# Patient Record
Sex: Female | Born: 1965 | Race: White | Hispanic: No | Marital: Married | Smoking: Current every day smoker
Health system: Southern US, Community
[De-identification: ages and names within clinical notes are randomized; demographics above are authoritative.]

---

## 1997-07-30 ENCOUNTER — Encounter: Admission: RE | Admit: 1997-07-30 | Discharge: 1997-07-30 | Payer: Self-pay | Admitting: Family Medicine

## 1997-08-17 ENCOUNTER — Encounter (INDEPENDENT_AMBULATORY_CARE_PROVIDER_SITE_OTHER): Payer: Self-pay | Admitting: *Deleted

## 1997-09-02 ENCOUNTER — Other Ambulatory Visit: Admission: RE | Admit: 1997-09-02 | Discharge: 1997-09-02 | Payer: Self-pay | Admitting: *Deleted

## 1997-09-02 ENCOUNTER — Encounter: Admission: RE | Admit: 1997-09-02 | Discharge: 1997-09-02 | Payer: Self-pay | Admitting: Sports Medicine

## 1997-09-08 ENCOUNTER — Ambulatory Visit (HOSPITAL_COMMUNITY): Admission: RE | Admit: 1997-09-08 | Discharge: 1997-09-08 | Payer: Self-pay | Admitting: *Deleted

## 1997-10-06 ENCOUNTER — Encounter: Admission: RE | Admit: 1997-10-06 | Discharge: 1997-10-06 | Payer: Self-pay | Admitting: Family Medicine

## 1998-02-12 ENCOUNTER — Encounter: Admission: RE | Admit: 1998-02-12 | Discharge: 1998-02-12 | Payer: Self-pay | Admitting: Family Medicine

## 1998-02-23 ENCOUNTER — Encounter: Admission: RE | Admit: 1998-02-23 | Discharge: 1998-02-23 | Payer: Self-pay | Admitting: Sports Medicine

## 1999-06-09 ENCOUNTER — Encounter: Admission: RE | Admit: 1999-06-09 | Discharge: 1999-06-09 | Payer: Self-pay | Admitting: Family Medicine

## 2006-03-17 ENCOUNTER — Encounter (INDEPENDENT_AMBULATORY_CARE_PROVIDER_SITE_OTHER): Payer: Self-pay | Admitting: *Deleted

## 2010-09-23 ENCOUNTER — Other Ambulatory Visit (HOSPITAL_COMMUNITY): Payer: Self-pay | Admitting: Gastroenterology

## 2010-09-27 ENCOUNTER — Encounter (HOSPITAL_COMMUNITY)
Admission: RE | Admit: 2010-09-27 | Discharge: 2010-09-27 | Disposition: A | Discharge status: Home / Self Care | Payer: BC Managed Care – PPO | Source: Ambulatory Visit | Attending: Gastroenterology | Admitting: Gastroenterology

## 2010-09-27 DIAGNOSIS — R1011 Right upper quadrant pain: Secondary | ICD-10-CM | POA: Insufficient documentation

## 2010-09-27 MED ORDER — TECHNETIUM TC 99M MEBROFENIN IV KIT
5.5000 | PACK | Freq: Once | INTRAVENOUS | Status: AC | PRN
  Administered 2010-09-27: 6 via INTRAVENOUS

## 2011-11-27 ENCOUNTER — Ambulatory Visit: Payer: BC Managed Care – PPO

## 2011-11-27 ENCOUNTER — Ambulatory Visit (INDEPENDENT_AMBULATORY_CARE_PROVIDER_SITE_OTHER): Payer: BC Managed Care – PPO | Admitting: Family Medicine

## 2011-11-27 VITALS — BP 94/65 | HR 72 | Temp 97.9°F | Resp 18 | Ht 68.0 in | Wt 148.0 lb

## 2011-11-27 DIAGNOSIS — M79609 Pain in unspecified limb: Secondary | ICD-10-CM

## 2011-11-27 DIAGNOSIS — M79606 Pain in leg, unspecified: Secondary | ICD-10-CM

## 2011-11-27 DIAGNOSIS — M79671 Pain in right foot: Secondary | ICD-10-CM

## 2011-11-27 DIAGNOSIS — M25561 Pain in right knee: Secondary | ICD-10-CM

## 2011-11-27 DIAGNOSIS — M25569 Pain in unspecified knee: Secondary | ICD-10-CM

## 2011-11-27 NOTE — Progress Notes (Signed)
[facility]:  Office Visit  Chief Complaint:  Chief Complaint  Patient presents with  . Leg Pain    rt leg pain x 2 months  hx of broken toe feel somthing since  has a few bad veins     HPI: Gina Mckenzie is a 45 y.o. female who complains of: Inventory specialist at [employer] Broke right 5th toe fracture x 2 years and foot has not been the same. Continues to have intermittent foot pain in 5th, 4th, 3rd toes when walking and at MTP pf 5th and 4th toes/  She has cramps in her calf but not  necussearily pain. Pain is in her butt cheek. Deneis any swelling. +Varicose veins. Posterior knee pain. She has pain in back of her knee when she walks. Dull pain , intermittent Has broken vessels with bruising allalong her leg at different areas at different times. Does not necessarily feel any different when walking or at rest. No h/o smoking, HTN, hyperlipidemia, CAD, PAD.     No h/o blood clots. Active. No long plane rides, no swelling. No SOB/DOE. No risk factors for DVT  History reviewed. No pertinent past medical history. History reviewed. No pertinent past surgical history. History   Social History  . Marital Status: Married    Spouse Name: N/A    Number of Children: N/A  . Years of Education: N/A   Social History Main Topics  . Smoking status: Current Every Day Smoker -- 0.5 packs/day    Types: Cigarettes  . Smokeless tobacco: Never Used  . Alcohol Use: No  . Drug Use: No  . Sexually Active: None   Other Topics Concern  . None   Social History Narrative  . None   No family history on file. No Known Allergies Prior to Admission medications   Not on File     ROS: The patient denies fevers, chills, night sweats, unintentional weight loss, chest pain, palpitations, wheezing, dyspnea on exertion, nausea, vomiting, abdominal pain, dysuria, hematuria, melena, numbness, weakness, or tingling.   All other systems have been reviewed and were otherwise negative  with the exception of those mentioned in the HPI and as above.    PHYSICAL EXAM: Filed Vitals:   11/27/11 1248  BP: 94/65  Pulse: 72  Temp: 97.9 F (36.6 C)  Resp: 18   Filed Vitals:   11/27/11 1248  Height: 5\' 8"  (1.727 m)  Weight: 148 lb (67.132 kg)   Body mass index is 22.50 kg/(m^2).  General: Alert, no acute distress HEENT:  Normocephalic, atraumatic, oropharynx patent.  Cardiovascular:  Regular rate and rhythm, no rubs murmurs or gallops.  No Carotid bruits, radial pulse intact. No pedal edema.  Respiratory: Clear to auscultation bilaterally.  No wheezes, rales, or rhonchi.  No cyanosis, no use of accessory musculature GI: No organomegaly, abdomen is soft and non-tender, positive bowel sounds.  No masses. Skin: No rashes. Neurologic: Facial musculature symmetric. Psychiatric: Patient is appropriate throughout our interaction. Lymphatic: No cervical lymphadenopathy Musculoskeletal: Gait intact. Right knee-+ posterior tenderness, anterior knee exam nl, no crepitus, no deformity, 5/5 strength, 2/2 DTR. Neg to varus/valgus stress, neg Lachman, neg jt line tenderness, + DP, spider vains along thigh, small bruise at larger varicosity on medial aspect. ? Edema posterior knee ? Cyst Right foot-nl ROM, 5/5 strength, nl sensation, + tender at MTP Right ankle-nl exam Right hip-nl exam  LABS: Results for orders placed in visit on 08/17/97  CONVERTED [facility] LAB  Component Value Range   Pap Smear Done.       EKG/XRAY:   Primary read interpreted by Dr. Conley Rolls at [facility] ? + right knee posterior osteophyte Right Old boney abnormality distal 5th metartarsal No acute fractures/subluxation  ASSESSMENT/PLAN: Encounter Diagnoses  Name Primary?  . Knee pain, right Yes  . Foot pain, right    Metatarsal Pads for foot pain ? Venous insufficiency resulting in intermittent  leg pain and burst varicose veins, along with popliteal cyst causing posterior knee pain. Unlikely DVT  Since no  risk factors Will get venous insufficiency study    LE, THAO PHUONG, DO 11/27/2011 1:55 PM

## 2011-12-02 ENCOUNTER — Ambulatory Visit
Admission: RE | Admit: 2011-12-02 | Discharge: 2011-12-02 | Disposition: A | Discharge status: Home / Self Care | Payer: BC Managed Care – PPO | Source: Ambulatory Visit | Attending: Family Medicine | Admitting: Family Medicine

## 2011-12-02 DIAGNOSIS — M79606 Pain in leg, unspecified: Secondary | ICD-10-CM

## 2011-12-02 DIAGNOSIS — M79671 Pain in right foot: Secondary | ICD-10-CM

## 2011-12-02 DIAGNOSIS — M25561 Pain in right knee: Secondary | ICD-10-CM

## 2011-12-05 ENCOUNTER — Other Ambulatory Visit: Payer: Self-pay | Admitting: Family Medicine

## 2011-12-05 DIAGNOSIS — M79671 Pain in right foot: Secondary | ICD-10-CM

## 2011-12-05 DIAGNOSIS — M25561 Pain in right knee: Secondary | ICD-10-CM

## 2011-12-05 DIAGNOSIS — M79606 Pain in leg, unspecified: Secondary | ICD-10-CM

## 2011-12-08 ENCOUNTER — Ambulatory Visit
Admission: RE | Admit: 2011-12-08 | Discharge: 2011-12-08 | Disposition: A | Discharge status: Home / Self Care | Payer: BC Managed Care – PPO | Source: Ambulatory Visit | Attending: Family Medicine | Admitting: Family Medicine

## 2011-12-08 ENCOUNTER — Other Ambulatory Visit: Payer: Self-pay | Admitting: Family Medicine

## 2011-12-08 DIAGNOSIS — M79671 Pain in right foot: Secondary | ICD-10-CM

## 2011-12-08 DIAGNOSIS — M25561 Pain in right knee: Secondary | ICD-10-CM

## 2011-12-08 DIAGNOSIS — M79606 Pain in leg, unspecified: Secondary | ICD-10-CM

## 2011-12-08 NOTE — Progress Notes (Signed)
Patient ID: Gina Mckenzie, female   DOB: 1965/04/05, 47 y.o.   MRN: [id number]  INTERVENTIONAL RADIOLOGY NEW PATIENT OFFICE VISIT  Chief Complaint: Right lower extremity pain.  History: The patient is referred by Dr. Conley Rolls for evaluation of possible venous insufficiency or symptomatic Baker's cyst.  Past Medical History: The patient is a 46 year old female with a history of approximately 2 years of right lower extremity pain, localizing primarily to the region of the popliteal fossa. She does have a history of a prior fifth toe fracture of the right foot two years ago and also does complain of periodic pain in the foot. The patient does not have any known history of varicose veins and has not had any prior varicose vein treatment. She has no history of DVT or pulmonary embolism. She has not had any right lower extremity surgery. She denies any edema or discoloration of the extremity. She has not had any ulcers. She does state that pain is worse with prolonged periods of standing.  Medications: No regular prescription medications or over-the- counter medications.  Allergies: No known drug allergies.  Social History: The patient is married and has two children aged 31 and 66. She lives in [place] and works for [employer]. She smokes approximately 15 cigarettes per day and has smoked for approximately 30 years. She denies alcohol use.  Family History: Family history of hypertension.  Review of Systems: General: 5 feet 7 inches in height and 149 pounds in weight. Gastrointestinal: No abdominal pain. Genitourinary: Two prior pregnancies and deliveries. No urinary symptoms. Chest: No chest pain. Neurologic: No weakness or difficulty with ambulation. Respiratory: No shortness of breath.  Exam: Vital signs: Blood pressure 111/63, pulse 71, respirations 16, temperature 99.2, oxygen saturation 99% on room air. General: No acute distress. Right lower extremity: No edema or  focal tenderness. Full range of motion and normal strength. No palpable varicosities or skin changes. Small healing bruise of the medial knee region. No palpable masses or fullness in the popliteal fossa. Normal palpable distal pedal pulses. Left lower extremity: No edema, palpable varicosities or masses. Normal strength and motion. Normal distal pulses.  Imaging: Venous evaluation of the right lower extremity was performed today. The left lower extremity was not studied with ultrasound given lack of any clinical symptoms on the left. The study is separately dictated and demonstrates no evidence of DVT or superficial venous insufficiency. No varicosities are identified. There is no evidence of Baker's cyst or soft tissue mass in the popliteal fossa.  Assessment and Plan: Evaluation today demonstrates no evidence of superficial venous insufficiency of the right lower extremity or popliteal fossa cyst to explain the patient's symptoms. No venous therapy is therefore indicated. I recommended that the patient seek orthopedic/sports medicine evaluation if there is any worsening pain localizing to the knee joint itself or the right foot.

## 2011-12-14 ENCOUNTER — Telehealth: Payer: Self-pay | Admitting: Family Medicine

## 2011-12-14 NOTE — Telephone Encounter (Signed)
LM that US venous insufficency negative report for venous refluz or popliteal cyst

## 2015-04-20 ENCOUNTER — Telehealth: Payer: Self-pay

## 2015-04-20 NOTE — Telephone Encounter (Signed)
Patient needs FMLA forms completed by Lanier ClamNicole Bush, so that she can take care of her mother. I have completed what I could from the OV on 03/23/15 (the mothers MRN is [id number]) I have highlighted the areas that need to be completed that I wasn't sure about, If you could complete this and return to the FMLA/Disability box at the 102 checkout desk within 5-7 business days. Thank you!

## 2015-04-23 NOTE — Telephone Encounter (Signed)
FMLA completed and ready for pick up.

## 2015-04-24 NOTE — Telephone Encounter (Signed)
Paperwork was scanned and faxed to [phone] on 04/24/15

## 2015-04-28 DIAGNOSIS — Z0271 Encounter for disability determination: Secondary | ICD-10-CM

## 2016-02-18 ENCOUNTER — Ambulatory Visit (INDEPENDENT_AMBULATORY_CARE_PROVIDER_SITE_OTHER): Payer: No Typology Code available for payment source | Admitting: Physician Assistant

## 2016-02-18 VITALS — BP 98/62 | HR 88 | Temp 98.7°F | Resp 16 | Ht 67.5 in | Wt 137.6 lb

## 2016-02-18 DIAGNOSIS — R69 Illness, unspecified: Secondary | ICD-10-CM | POA: Diagnosis not present

## 2016-02-18 DIAGNOSIS — J111 Influenza due to unidentified influenza virus with other respiratory manifestations: Secondary | ICD-10-CM

## 2016-02-18 MED ORDER — OSELTAMIVIR PHOSPHATE 75 MG PO CAPS: 75.0000 mg | ORAL_CAPSULE | Freq: Two times a day (BID) | ORAL | 0 refills | Status: DC

## 2016-02-18 NOTE — Progress Notes (Signed)
  02/18/2016 3:44 PM   DOB: 11/19/1965 / MRN: [id number]  SUBJECTIVE:  Kendra OpitzVESNA Pursifull is a 51 y.o. female presenting for an illness that started Tuesday night. Yesterday she has had chills, HA, and mild myalgia, cough along with fatigue.  Denies nasal congestion and sore throat.  Has been taking nyquil and dayquil. She is pushing fluids but is not hungry.  She denies abdominal pain, chest pain, SOB, leg swelling.   She has not had the flu shot.   She has No Known Allergies.   She  has no past medical history on file.    She  reports that she has been smoking Cigarettes.  She has been smoking about 0.50 packs per day. She has never used smokeless tobacco. She reports that she does not drink alcohol or use drugs. She  has no sexual activity history on file. The patient  has no past surgical history on file.  Her family history is not on file.  Review of Systems  Constitutional: Negative for chills and fever.  Skin: Negative for itching and rash.  Neurological: Positive for dizziness.    The problem list and medications were reviewed and updated by myself where necessary and exist elsewhere in the encounter.   OBJECTIVE:  BP 98/62 (BP Location: Right Arm, Patient Position: Sitting, Cuff Size: Normal)   Pulse 88   Temp 98.7 F (37.1 C) (Oral)   Resp 16   Ht 5' 7.5" (1.715 m)   Wt 137 lb 9.6 oz (62.4 kg)   SpO2 95%   BMI 21.23 kg/m    Physical Exam  Constitutional: She is oriented to person, place, and time. She appears well-developed and well-nourished.  HENT:  Right Ear: External ear normal.  Left Ear: External ear normal.  Mouth/Throat: Oropharynx is clear and moist. No oropharyngeal exudate.  Cardiovascular: Normal rate, regular rhythm and normal heart sounds.   Pulmonary/Chest: Effort normal and breath sounds normal. She has no wheezes. She has no rales.  Musculoskeletal: Normal range of motion.  Neurological: She is alert and oriented to person, place, and time.  Skin: Skin is  warm and dry.  Psychiatric: She has a normal mood and affect.    No results found for this or any previous visit (from the past 72 hour(s)).  No results found.  ASSESSMENT AND PLAN:  Nijae was seen today for headache and chills.  Diagnoses and all orders for this visit:  Influenza-like illness: Normal exam.  Likely flu.  She has not had the flu shot.  Benign health history.  Starting tamiflu.  Prescribing tamiflu prophylaxis for her mother who is elderly and also a patient of mine.  Sent via [facility]. Out of work until Monday.  -     oseltamivir (TAMIFLU) 75 MG capsule; Take 1 capsule (75 mg total) by mouth 2 (two) times daily.    The patient is advised to call or return to clinic if she does not see an improvement in symptoms, or to seek the care of the closest emergency department if she worsens with the above plan.   Deliah BostonMichael Clark, MHS, PA-C [facility] 02/18/2016 3:44 PM

## 2016-02-18 NOTE — Patient Instructions (Addendum)
Take 2-3 tabs of ibuprofen every 6-8 hours as needed for pain.      IF you received an x-ray today, you will receive an invoice from [facility]. Please contact [facility] at [phone] with questions or concerns regarding your invoice.   IF you received labwork today, you will receive an invoice from [facility]. Please contact [facility] at [phone] with questions or concerns regarding your invoice.   Our billing staff will not be able to assist you with questions regarding bills from these companies.  You will be contacted with the lab results as soon as they are available. The fastest way to get your results is to activate your My Chart account. Instructions are located on the last page of this paperwork. If you have not heard from us regarding the results in 2 weeks, please contact this office.

## 2016-03-24 ENCOUNTER — Ambulatory Visit: Payer: Self-pay | Admitting: Registered Nurse

## 2016-03-24 VITALS — BP 98/58 | HR 68 | Temp 97.4°F

## 2016-03-24 DIAGNOSIS — H65193 Other acute nonsuppurative otitis media, bilateral: Secondary | ICD-10-CM

## 2016-03-24 MED ORDER — IBUPROFEN 800 MG PO TABS: 800.0000 mg | ORAL_TABLET | Freq: Three times a day (TID) | ORAL | 0 refills | Status: DC

## 2016-03-24 NOTE — Patient Instructions (Signed)
Take amoxicillin 875mg  by mouth every 12 hours x 10 days Motrin 800mg  by mouth three times per day x 2 days Consider OTC antihistamine claritin/zyrtec 10mg  po daily and/or  Fluticasone 50mcg 1 spray each nostril twice a day

## 2016-03-24 NOTE — Progress Notes (Signed)
Pt c/o R ear pain extending down neck since last night. Denies ear drainage. Took Ibuprofen 800mg  this am with relief.  Subjective:    Patient ID: Gina Mckenzie, female    DOB: [birth year], 51 y.o.   MRN: [id number]  51y/o caucasian female married here for evaluation right ear pain woke her up in middle of night.  Pain with swallowing.  RN Nance Pew gave her motrin from clinic stock this am and it helped a great deal with pain.  Denied discharge/bleeding ears.  Denied seasonal allergies/recent URI symptoms.  Denied teeth pain/cough/nausea/vomiting/diarrhea/rash.  Hurts to touch outside of ear right also and coworker thought her face was swollen this morning before she took motrin.       Review of Systems  Constitutional: Negative for activity change, appetite change, chills, diaphoresis, fatigue, fever and unexpected weight change.  HENT: Positive for ear pain and facial swelling. Negative for congestion, dental problem, drooling, ear discharge, hearing loss, mouth sores, nosebleeds, postnasal drip, rhinorrhea, sinus pain, sinus pressure, sneezing, sore throat, tinnitus, trouble swallowing and voice change.   Eyes: Negative for photophobia, pain, discharge, redness, itching and visual disturbance.  Respiratory: Negative for cough, choking, chest tightness, shortness of breath, wheezing and stridor.   Cardiovascular: Negative for chest pain, palpitations and leg swelling.  Gastrointestinal: Negative for abdominal distention, abdominal pain, blood in stool, constipation, diarrhea, nausea and vomiting.  Endocrine: Negative for cold intolerance and heat intolerance.  Genitourinary: Negative for difficulty urinating, dysuria and hematuria.  Musculoskeletal: Negative for arthralgias, back pain, gait problem, joint swelling, myalgias, neck pain and neck stiffness.  Skin: Negative for color change, pallor, rash and wound.  Allergic/Immunologic: Negative for environmental allergies and food allergies.   Neurological: Negative for dizziness, tremors, seizures, syncope, facial asymmetry, speech difficulty, weakness, light-headedness, numbness and headaches.  Hematological: Negative for adenopathy. Does not bruise/bleed easily.  Psychiatric/Behavioral: Negative for agitation, behavioral problems, confusion and sleep disturbance.       Objective:   Physical Exam  Constitutional: She is oriented to person, place, and time. Vital signs are normal. She appears well-developed and well-nourished. She is active and cooperative.  Non-toxic appearance. She does not have a sickly appearance. She does not appear ill. No distress.  HENT:  Head: Normocephalic and atraumatic.  Right Ear: Hearing and ear canal normal. There is tenderness. Tympanic membrane is erythematous and bulging. A middle ear effusion is present.  Left Ear: Hearing and ear canal normal. There is tenderness. Tympanic membrane is erythematous and bulging. A middle ear effusion is present.  Nose: Mucosal edema and rhinorrhea present. No nose lacerations, sinus tenderness, nasal deformity, septal deviation or nasal septal hematoma. No epistaxis.  No foreign bodies. Right sinus exhibits no maxillary sinus tenderness and no frontal sinus tenderness. Left sinus exhibits no maxillary sinus tenderness and no frontal sinus tenderness.  Mouth/Throat: Uvula is midline and mucous membranes are normal. Mucous membranes are not pale, not dry and not cyanotic. She does not have dentures. No oral lesions. No trismus in the jaw. Normal dentition. No dental abscesses, uvula swelling, lacerations or dental caries. Posterior oropharyngeal edema and posterior oropharyngeal erythema present. No oropharyngeal exudate or tonsillar abscesses.  Cobblestoning posterior pharynx; bilateral TMS air fluid level clear; TM injection 1/4 surface and auditory canal with erythema 4-8 oclock no debris noted; bilateral allergic shiners; bilateral nasal turbinates edema/erythema  clear discharge  Eyes: Conjunctivae, EOM and lids are normal. Pupils are equal, round, and reactive to light. Right eye exhibits no chemosis, no discharge, no  exudate and no hordeolum. No foreign body present in the right eye. Left eye exhibits no chemosis, no discharge, no exudate and no hordeolum. No foreign body present in the left eye. Right conjunctiva is not injected. Right conjunctiva has no hemorrhage. Left conjunctiva is not injected. Left conjunctiva has no hemorrhage. No scleral icterus. Right eye exhibits normal extraocular motion and no nystagmus. Left eye exhibits normal extraocular motion and no nystagmus. Right pupil is round and reactive. Left pupil is round and reactive. Pupils are equal.  Neck: Trachea normal and normal range of motion. Neck supple. No tracheal tenderness, no spinous process tenderness and no muscular tenderness present. No neck rigidity. No tracheal deviation, no edema, no erythema and normal range of motion present. No thyroid mass and no thyromegaly present.  Cardiovascular: Normal rate, regular rhythm, S1 normal, S2 normal, normal heart sounds and intact distal pulses.  PMI is not displaced.  Exam reveals no gallop and no friction rub.   No murmur heard. Pulmonary/Chest: Effort normal and breath sounds normal. No accessory muscle usage or stridor. No respiratory distress. She has no decreased breath sounds. She has no wheezes. She has no rhonchi. She has no rales. She exhibits no tenderness.  Abdominal: Soft. She exhibits no distension.  Musculoskeletal: Normal range of motion. She exhibits no edema or tenderness.       Right shoulder: Normal.       Left shoulder: Normal.       Right hip: Normal.       Left hip: Normal.       Right knee: Normal.       Left knee: Normal.       Cervical back: Normal.       Right hand: Normal.       Left hand: Normal.  Lymphadenopathy:       Head (right side): No submental, no submandibular, no tonsillar, no preauricular, no  posterior auricular and no occipital adenopathy present.       Head (left side): No submental, no submandibular, no tonsillar, no preauricular, no posterior auricular and no occipital adenopathy present.    She has no cervical adenopathy.       Right cervical: No superficial cervical, no deep cervical and no posterior cervical adenopathy present.      Left cervical: No superficial cervical, no deep cervical and no posterior cervical adenopathy present.  Neurological: She is alert and oriented to person, place, and time. She has normal strength. She is not disoriented. She displays no atrophy and no tremor. No cranial nerve deficit or sensory deficit. She exhibits normal muscle tone. She displays no seizure activity. Coordination and gait normal. GCS eye subscore is 4. GCS verbal subscore is 5. GCS motor subscore is 6.  Skin: Skin is warm, dry and intact. No abrasion, no bruising, no burn, no ecchymosis, no laceration, no lesion, no petechiae and no rash noted. She is not diaphoretic. No cyanosis or erythema. No pallor. Nails show no clubbing.  Psychiatric: She has a normal mood and affect. Her speech is normal and behavior is normal. Judgment and thought content normal. Cognition and memory are normal.  Nursing note and vitals reviewed.         Assessment & Plan:  A-bilateral otitis media acute nonsupportive  P-Treatment as ordered. Amoxicillin 875mg  po BID X 10 days #[facility] 800mg  po TID prn pain x 2 days given UD from clinic stock x 6. Symptomatic therapy suggested fluids, NSAIDs and rest.  May take Tylenol or Motrin for fevers.  Call or return to clinic as needed if these symptoms worsen or fail to improve as anticipated.  Patient verbalized agreement and understanding of treatment plan and had no further questions at this time. P2:  Hand washing

## 2016-09-16 ENCOUNTER — Ambulatory Visit (INDEPENDENT_AMBULATORY_CARE_PROVIDER_SITE_OTHER): Payer: No Typology Code available for payment source | Admitting: Physician Assistant

## 2016-09-16 ENCOUNTER — Encounter: Payer: Self-pay | Admitting: Physician Assistant

## 2016-09-16 ENCOUNTER — Ambulatory Visit (INDEPENDENT_AMBULATORY_CARE_PROVIDER_SITE_OTHER): Payer: No Typology Code available for payment source

## 2016-09-16 VITALS — BP 118/71 | HR 68 | Temp 98.7°F | Resp 16 | Ht 67.5 in | Wt 143.2 lb

## 2016-09-16 DIAGNOSIS — M542 Cervicalgia: Secondary | ICD-10-CM | POA: Diagnosis not present

## 2016-09-16 DIAGNOSIS — M62838 Other muscle spasm: Secondary | ICD-10-CM

## 2016-09-16 MED ORDER — MELOXICAM 7.5 MG PO TABS: 7.5000 mg | ORAL_TABLET | Freq: Every day | ORAL | 1 refills | Status: DC

## 2016-09-16 MED ORDER — CYCLOBENZAPRINE HCL 10 MG PO TABS: 10.0000 mg | ORAL_TABLET | Freq: Three times a day (TID) | ORAL | 0 refills | Status: DC | PRN

## 2016-09-16 MED ORDER — HYDROCODONE-ACETAMINOPHEN 7.5-325 MG PO TABS: 1.0000 | ORAL_TABLET | Freq: Four times a day (QID) | ORAL | 0 refills | Status: DC | PRN

## 2016-09-16 NOTE — Patient Instructions (Addendum)
See below for tips on managing pain, stiffness and swelling.  Take Norco at night to help you sleep. Use a tennis ball to help massage your neck.   Put ice in a plastic bag. Place a towel between your skin and the bag or between your plaster splint and the bag. Leave the ice on for 20 minutes, 2-3 times a day. Ibuprofen and/or Tylenol for pain. Keep the area elevated above the level of your heart. Do this 2-3 times a day until swelling improves.  Massage your muscles to help them relax. Stretch your neck and upper back daily.  You may be stiff and in pain for 3 weeks. Continue using heat and/or ice to help you feel better.   Thank you for coming in today. I hope you feel we met your needs.  Feel free to call PCP if you have any questions or further requests.  Please consider signing up for MyChart if you do not already have it, as this is a great way to communicate with me.  Best,  Whitney McVey, PA-C  Cervical Sprain A cervical sprain is a stretch or tear in one or more of the tough, cord-like tissues that connect bones (ligaments) in the neck. Cervical sprains can range from mild to severe. Severe cervical sprains can cause the spinal bones (vertebrae) in the neck to be unstable. This can lead to spinal cord damage and can result in serious nervous system problems. The amount of time that it takes for a cervical sprain to get better depends on the cause and extent of the injury. Most cervical sprains heal in 4-6 weeks. What are the causes? Cervical sprains may be caused by an injury (trauma), such as from a motor vehicle accident, a fall, or sudden forward and backward whipping movement of the head and neck (whiplash injury). Mild cervical sprains may be caused by wear and tear over time, such as from poor posture, sitting in a chair that does not provide support, or looking up or down for long periods of time. What increases the risk? The following factors may make you more likely to  develop this condition:  Participating in activities that have a high risk of trauma to the neck. These include contact sports, auto racing, gymnastics, and diving.  Taking risks when driving or riding in a motor vehicle, such as speeding.  Having osteoarthritis of the spine.  Having poor strength and flexibility of the neck.  A previous neck injury.  Having poor posture.  Spending a lot of time in certain positions that put stress on the neck, such as sitting at a computer for long periods of time.  What are the signs or symptoms? Symptoms of this condition include:  Pain, soreness, stiffness, tenderness, swelling, or a burning sensation in the front, back, or sides of the neck.  Sudden tightening of neck muscles that you cannot control (muscle spasms).  Pain in the shoulders or upper back.  Limited ability to move the neck.  Headache.  Dizziness.  Nausea.  Vomiting.  Weakness, numbness, or tingling in a hand or an arm.  Symptoms may develop right away after injury, or they may develop over a few days. In some cases, symptoms may go away with treatment and return (recur) over time. How is this diagnosed? This condition may be diagnosed based on:  Your medical history.  Your symptoms.  Any recent injuries or known neck problems that you have, such as arthritis in the neck.  A physical  exam.  Imaging tests, such as: ? X-rays. ? MRI. ? CT scan.  How is this treated? This condition is treated by resting and icing the injured area and doing physical therapy exercises. Depending on the severity of your condition, treatment may also include:  Keeping your neck in place (immobilized) for periods of time. This may be done using: ? A cervical collar. This supports your chin and the back of your head. ? A cervical traction device. This is a sling that holds up your head. This removes weight and pressure from your neck, and it may help to relieve pain.  Medicines  that help to relieve pain and inflammation.  Medicines that help to relax your muscles (muscle relaxants).  Surgery. This is rare.  Follow these instructions at home: If you have a cervical collar:  Wear it as told by your health care provider. Do not remove the collar unless instructed by your health care provider.  Ask your health care provider before you make any adjustments to your collar.  If you have long hair, keep it outside of the collar.  Ask your health care provider if you can remove the collar for cleaning and bathing. If you are allowed to remove the collar for cleaning or bathing: ? Follow instructions from your health care provider about how to remove the collar safely. ? Clean the collar by wiping it with mild soap and water and drying it completely. ? If your collar has removable pads, remove them every 1-2 days and wash them by hand with soap and water. Let them air-dry completely before you put them back in the collar. ? Check your skin under the collar for irritation or sores. If you see any, tell your health care provider. Managing pain, stiffness, and swelling  If directed, use a cervical traction device as told by your health care provider.  If directed, apply heat to the affected area before you do your physical therapy or as often as told by your health care provider. Use the heat source that your health care provider recommends, such as a moist heat pack or a heating pad. ? Place a towel between your skin and the heat source. ? Leave the heat on for 20-30 minutes. ? Remove the heat if your skin turns bright red. This is especially important if you are unable to feel pain, heat, or cold. You may have a greater risk of getting burned.  If directed, put ice on the affected area: ? Put ice in a plastic bag. ? Place a towel between your skin and the bag. ? Leave the ice on for 20 minutes, 2-3 times a day. Activity  Do not drive while wearing a cervical collar.  If you do not have a cervical collar, ask your health care provider if it is safe to drive while your neck heals.  Do not drive or use heavy machinery while taking prescription pain medicine or muscle relaxants, unless your health care provider approves.  Do not lift anything that is heavier than 10 lb (4.5 kg) until your health care provider tells you that it is safe.  Rest as directed by your health care provider. Avoid positions and activities that make your symptoms worse. Ask your health care provider what activities are safe for you.  If physical therapy was prescribed, do exercises as told by your health care provider or physical therapist. General instructions  Take over-the-counter and prescription medicines only as told by your health care provider.  Do not use any products that contain nicotine or tobacco, such as cigarettes and e-cigarettes. These can delay healing. If you need help quitting, ask your health care provider.  Keep all follow-up visits as told by your health care provider or physical therapist. This is important. How is this prevented? To prevent a cervical sprain from happening again:  Use and maintain good posture. Make any needed adjustments to your workstation to help you use good posture.  Exercise regularly as directed by your health care provider or physical therapist.  Avoid risky activities that may cause a cervical sprain.  Contact a health care provider if:  You have symptoms that get worse or do not get better after 2 weeks of treatment.  You have pain that gets worse or does not get better with medicine.  You develop new, unexplained symptoms.  You have sores or irritated skin on your neck from wearing your cervical collar. Get help right away if:  You have severe pain.  You develop numbness, tingling, or weakness in any part of your body.  You cannot move a part of your body (you have paralysis).  You have neck pain along  with: ? Severe dizziness. ? Headache. Summary  A cervical sprain is a stretch or tear in one or more of the tough, cord-like tissues that connect bones (ligaments) in the neck.  Cervical sprains may be caused by an injury (trauma), such as from a motor vehicle accident, a fall, or sudden forward and backward whipping movement of the head and neck (whiplash injury).  Symptoms may develop right away after injury, or they may develop over a few days.  This condition is treated by resting and icing the injured area and doing physical therapy exercises. This information is not intended to replace advice given to you by your health care provider. Make sure you discuss any questions you have with your health care provider. Document Released: 10/31/2006 Document Revised: 09/02/2015 Document Reviewed: 09/02/2015 Elsevier Interactive Patient Education  2017 [employer].  IF you received an x-ray today, you will receive an invoice from [facility]. Please contact [facility] at [phone] with questions or concerns regarding your invoice.   IF you received labwork today, you will receive an invoice from [place]. Please contact [facility] at [phone] with questions or concerns regarding your invoice.   Our billing staff will not be able to assist you with questions regarding bills from these companies.  You will be contacted with the lab results as soon as they are available. The fastest way to get your results is to activate your My Chart account. Instructions are located on the last page of this paperwork. If you have not heard from [place] regarding the results in 2 weeks, please contact this office.
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
## 2016-09-21 ENCOUNTER — Ambulatory Visit (INDEPENDENT_AMBULATORY_CARE_PROVIDER_SITE_OTHER): Payer: No Typology Code available for payment source | Admitting: Physician Assistant

## 2016-09-21 ENCOUNTER — Encounter: Payer: Self-pay | Admitting: Physician Assistant

## 2016-09-21 VITALS — BP 109/72 | HR 77 | Temp 98.7°F | Resp 16 | Ht 67.5 in | Wt 142.2 lb

## 2016-09-21 DIAGNOSIS — M542 Cervicalgia: Secondary | ICD-10-CM | POA: Diagnosis not present

## 2016-09-21 DIAGNOSIS — R51 Headache: Secondary | ICD-10-CM

## 2016-09-21 DIAGNOSIS — R519 Headache, unspecified: Secondary | ICD-10-CM

## 2016-09-21 NOTE — Patient Instructions (Addendum)
Take two meloxicam (15 mg total) daily. Come back on Monday if you are still feeling poorly.     IF you received an x-ray today, you will receive an invoice from [facility]. Please contact [facility] at [phone] with questions or concerns regarding your invoice.   IF you received labwork today, you will receive an invoice from [facility]. Please contact [facility] at [phone] with questions or concerns regarding your invoice.   Our billing staff will not be able to assist you with questions regarding bills from these companies.  You will be contacted with the lab results as soon as they are available. The fastest way to get your results is to activate your My Chart account. Instructions are located on the last page of this paperwork. If you have not heard from us regarding the results in 2 weeks, please contact this office.
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
## 2016-09-23 ENCOUNTER — Telehealth: Payer: Self-pay | Admitting: Family Medicine

## 2016-09-23 NOTE — Telephone Encounter (Signed)
Please see note.

## 2016-09-26 ENCOUNTER — Telehealth: Payer: Self-pay | Admitting: Physician Assistant
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
## 2016-09-27 ENCOUNTER — Ambulatory Visit: Payer: Self-pay | Admitting: Registered Nurse

## 2016-09-27 VITALS — BP 115/72 | HR 66 | Temp 98.8°F

## 2016-09-27 DIAGNOSIS — H65192 Other acute nonsuppurative otitis media, left ear: Secondary | ICD-10-CM

## 2016-09-27 DIAGNOSIS — S134XXD Sprain of ligaments of cervical spine, subsequent encounter: Secondary | ICD-10-CM

## 2016-09-27 DIAGNOSIS — J0101 Acute recurrent maxillary sinusitis: Secondary | ICD-10-CM

## 2016-09-27 DIAGNOSIS — S060X9D Concussion with loss of consciousness of unspecified duration, subsequent encounter: Secondary | ICD-10-CM

## 2016-09-27 DIAGNOSIS — G8911 Acute pain due to trauma: Secondary | ICD-10-CM

## 2016-09-27 DIAGNOSIS — M25512 Pain in left shoulder: Secondary | ICD-10-CM

## 2016-09-27 MED ORDER — SALINE SPRAY 0.65 % NA SOLN
2.0000 | NASAL | 0 refills | Status: DC
Start: 2016-09-27 — End: 2017-04-07

## 2016-09-27 MED ORDER — MENTHOL (TOPICAL ANALGESIC) 4 % EX GEL: 1.0000 | Freq: Four times a day (QID) | CUTANEOUS | 0 refills | Status: DC | PRN

## 2016-09-27 MED ORDER — FLUTICASONE PROPIONATE 50 MCG/ACT NA SUSP: 1.0000 | Freq: Two times a day (BID) | NASAL | 0 refills | Status: DC

## 2016-09-27 MED ORDER — ACETAMINOPHEN 500 MG PO TABS: 1000.0000 mg | ORAL_TABLET | Freq: Four times a day (QID) | ORAL | 0 refills | Status: AC | PRN

## 2016-09-27 MED ORDER — AMOXICILLIN-POT CLAVULANATE 875-125 MG PO TABS: 1.0000 | ORAL_TABLET | Freq: Two times a day (BID) | ORAL | 0 refills | Status: AC

## 2016-09-27 NOTE — Patient Instructions (Addendum)
Concussion, Adult A concussion is a brain injury from a direct hit (blow) to the head or body. This blow causes the brain to shake quickly back and forth inside the skull. This can damage brain cells and cause chemical changes in the brain. A concussion may also be known as a mild traumatic brain injury (TBI). Concussions are usually not life-threatening, but the effects of a concussion can be serious. If you have a concussion, you are more likely to experience concussion-like symptoms after a direct blow to the head in the future. What are the causes? This condition is caused by:  A direct blow to the head, such as from running into another player during a game, being hit in a fight, or hitting your head on a hard surface.  A jolt of the head or neck that causes the brain to move back and forth inside the skull, such as in a car crash.  What are the signs or symptoms? The signs of a concussion can be hard to notice. Early on, they may be missed by you, family members, and health care providers. You may look fine but act or feel differently. Symptoms are usually temporary, but they may last for days, weeks, or even longer. Some symptoms may appear right away but other symptoms may not show up for hours or days. Every head injury is different. Symptoms may include:  Headaches. This can include a feeling of pressure in the head.  Memory problems.  Trouble concentrating, organizing, or making decisions.  Slowness in thinking, acting or reacting, speaking, or reading.  Confusion.  Fatigue.  Changes in eating or sleeping patterns.  Problems with coordination or balance.  Nausea or vomiting.  Numbness or tingling.  Sensitivity to light or noise.  Vision or hearing problems.  Reduced sense of smell.  Irritability or mood changes.  Dizziness.  Lack of motivation.  Seeing or hearing things that other people do not see or hear (hallucinations).  How is this diagnosed? This  condition is diagnosed based on:  Your symptoms.  A description of your injury.  You may also have tests, including:  Imaging tests, such as a CT scan or MRI. These are done to look for signs of brain injury.  Neuropsychological tests. These measure your thinking, understanding, learning, and remembering abilities.  How is this treated? This condition is treated with physical and mental rest and careful observation, usually at home. If the concussion is severe, you may need to stay home from work for a while. You may be referred to a concussion clinic or to other health care providers for management. It is important that you tell your health care provider if:  You are taking any medicines, including prescription medicines, over-the-counter medicines, and natural remedies. Some medicines, such as blood thinners (anticoagulants) and aspirin, may increase the chance of complications, such as bleeding.  You are taking or have taken alcohol or illegal drugs. Alcohol and certain other drugs may slow your recovery and can put you at risk of further injury.  How fast you will recover from a concussion depends on many factors, such as how severe your concussion is, what part of your brain was injured, how old you are, and how healthy you were before the concussion. Recovery can take time. It is important to wait to return to activity until a health care provider says it is safe to do that and your symptoms are completely gone. Follow these instructions at home: Activity  Limit activities that   require a lot of thought or concentration. These may include: ? Doing homework or job-related work. ? Watching TV. ? Working on the computer. ? Playing memory games and puzzles.  Rest. Rest helps the brain to heal. Make sure you: ? Get plenty of sleep at night. Avoid staying up late at night. ? Keep the same bedtime hours on weekends and weekdays. ? Rest during the day. Take naps or rest breaks when you  feel tired.  Having another concussion before the first one has healed can be dangerous. Do not do high-risk activities that could cause a second concussion, such as riding a bicycle or playing sports.  Ask your health care provider when you can return to your normal activities, such as school, work, athletics, driving, riding a bicycle, or using heavy machinery. Your ability to react may be slower after a brain injury. Never do these activities if you are dizzy. Your health care provider will likely give you a plan for gradually returning to activities. General instructions  Take over-the-counter and prescription medicines only as told by your health care provider.  Do not drink alcohol until your health care provider says you can.  If it is harder than usual to remember things, write them down.  If you are easily distracted, try to do one thing at a time. For example, do not try to watch TV while fixing dinner.  Talk with family members or close friends when making important decisions.  Watch your symptoms and tell others to do the same. Complications sometimes occur after a concussion. Older adults with a brain injury may have a higher risk of serious complications, such as a blood clot in the brain.  Tell your teachers, school nurse, school counselor, coach, athletic trainer, or work manager about your injury, symptoms, and restrictions. Tell them about what you can or cannot do. They should watch for: ? Increased problems with attention or concentration. ? Increased difficulty remembering or learning new information. ? Increased time needed to complete tasks or assignments. ? Increased irritability or decreased ability to cope with stress. ? Increased symptoms.  Keep all follow-up visits as told by your health care provider. This is important. How is this prevented? It is very important to avoid another brain injury, especially as you recover. In rare cases, another injury can lead  to permanent brain damage, brain swelling, or death. The risk of this is greatest during the first 7-10 days after a head injury. Avoid injuries by:  Wearing a seat belt when riding in a car.  Wearing a helmet when biking, skiing, skateboarding, skating, or doing similar activities.  Avoiding activities that could lead to a second concussion, such as contact or recreational sports, until your health care provider says it is okay.  Taking safety measures in your home, such as: ? Removing clutter and tripping hazards from floors and stairways. ? Using grab bars in bathrooms and handrails by stairs. ? Placing non-slip mats on floors and in bathtubs. ? Improving lighting in dim areas.  Contact a health care provider if:  Your symptoms get worse.  You have new symptoms.  You continue to have symptoms for more than 2 weeks. Get help right away if:  You have severe or worsening headaches.  You have weakness or numbness in any part of your body.  Your coordination gets worse.  You vomit repeatedly.  You are sleepier.  The pupil of one eye is larger than the other.  You have convulsions or a   seizure.  Your speech is slurred.  Your fatigue, confusion, or irritability gets worse.  You cannot recognize people or places.  You have neck pain.  It is difficult to wake you up.  You have unusual behavior changes.  You lose consciousness. Summary  A concussion is a brain injury from a direct hit (blow) to the head or body.  A concussion may also be called a mild traumatic brain injury (TBI).  You may have imaging tests and neuropsychological tests to diagnose a concussion.  This condition is treated with physical and mental rest and careful observation.  Ask your health care provider when you can return to your normal activities, such as school, work, athletics, driving, riding a bicycle, or using heavy machinery. Follow safety instructions as told by your health care  provider. This information is not intended to replace advice given to you by your health care provider. Make sure you discuss any questions you have with your health care provider. Document Released: 03/26/2003 Document Revised: 12/15/2015 Document Reviewed: 12/15/2015 Elsevier Interactive Patient Education  2017 [employer]  Sinusitis, Adult Sinusitis is soreness and inflammation of your sinuses. Sinuses are hollow spaces in the bones around your face. Your sinuses are located:  Around your eyes.  In the middle of your forehead.  Behind your nose.  In your cheekbones.  Your sinuses and nasal passages are lined with a stringy fluid (mucus). Mucus normally drains out of your sinuses. When your nasal tissues become inflamed or swollen, the mucus can become trapped or blocked so air cannot flow through your sinuses. This allows bacteria, viruses, and funguses to grow, which leads to infection. Sinusitis can develop quickly and last for 7?10 days (acute) or for more than 12 weeks (chronic). Sinusitis often develops after a cold. What are the causes? This condition is caused by anything that creates swelling in the sinuses or stops mucus from draining, including:  Allergies.  Asthma.  Bacterial or viral infection.  Abnormally shaped bones between the nasal passages.  Nasal growths that contain mucus (nasal polyps).  Narrow sinus openings.  Pollutants, such as chemicals or irritants in the air.  A foreign object stuck in the nose.  A fungal infection. This is rare.  What increases the risk? The following factors may make you more likely to develop this condition:  Having allergies or asthma.  Having had a recent cold or respiratory tract infection.  Having structural deformities or blockages in your nose or sinuses.  Having a weak immune system.  Doing a lot of swimming or diving.  Overusing nasal sprays.  Smoking.  What are the signs or symptoms? The main  symptoms of this condition are pain and a feeling of pressure around the affected sinuses. Other symptoms include:  Upper toothache.  Earache.  Headache.  Bad breath.  Decreased sense of smell and taste.  A cough that may get worse at night.  Fatigue.  Fever.  Thick drainage from your nose. The drainage is often green and it may contain pus (purulent).  Stuffy nose or congestion.  Postnasal drip. This is when extra mucus collects in the throat or back of the nose.  Swelling and warmth over the affected sinuses.  Sore throat.  Sensitivity to light.  How is this diagnosed? This condition is diagnosed based on symptoms, a medical history, and a physical exam. To find out if your condition is acute or chronic, your health care provider may:  Look in your nose for signs of nasal  polyps.  Tap over the affected sinus to check for signs of infection.  View the inside of your sinuses using an imaging device that has a light attached (endoscope).  If your health care provider suspects that you have chronic sinusitis, you may also:  Be tested for allergies.  Have a sample of mucus taken from your nose (nasal culture) and checked for bacteria.  Have a mucus sample examined to see if your sinusitis is related to an allergy.  If your sinusitis does not respond to treatment and it lasts longer than 8 weeks, you may have an MRI or CT scan to check your sinuses. These scans also help to determine how severe your infection is. In rare cases, a bone biopsy may be done to rule out more serious types of fungal sinus disease. How is this treated? Treatment for sinusitis depends on the cause and whether your condition is chronic or acute. If a virus is causing your sinusitis, your symptoms will go away on their own within 10 days. You may be given medicines to relieve your symptoms, including:  Topical nasal decongestants. They shrink swollen nasal passages and let mucus drain from your  sinuses.  Antihistamines. These drugs block inflammation that is triggered by allergies. This can help to ease swelling in your nose and sinuses.  Topical nasal corticosteroids. These are nasal sprays that ease inflammation and swelling in your nose and sinuses.  Nasal saline washes. These rinses can help to get rid of thick mucus in your nose.  If your condition is caused by bacteria, you will be given an antibiotic medicine. If your condition is caused by a fungus, you will be given an antifungal medicine. Surgery may be needed to correct underlying conditions, such as narrow nasal passages. Surgery may also be needed to remove polyps. Follow these instructions at home: Medicines  Take, use, or apply over-the-counter and prescription medicines only as told by your health care provider. These may include nasal sprays.  If you were prescribed an antibiotic medicine, take it as told by your health care provider. Do not stop taking the antibiotic even if you start to feel better. Hydrate and Humidify  Drink enough water to keep your urine clear or pale yellow. Staying hydrated will help to thin your mucus.  Use a cool mist humidifier to keep the humidity level in your home above 50%.  Inhale steam for 10-15 minutes, 3-4 times a day or as told by your health care provider. You can do this in the bathroom while a hot shower is running.  Limit your exposure to cool or dry air. Rest  Rest as much as possible.  Sleep with your head raised (elevated).  Make sure to get enough sleep each night. General instructions  Apply a warm, moist washcloth to your face 3-4 times a day or as told by your health care provider. This will help with discomfort.  Wash your hands often with soap and water to reduce your exposure to viruses and other germs. If soap and water are not available, use hand sanitizer.  Do not smoke. Avoid being around people who are smoking (secondhand smoke).  Keep all  follow-up visits as told by your health care provider. This is important. Contact a health care provider if:  You have a fever.  Your symptoms get worse.  Your symptoms do not improve within 10 days. Get help right away if:  You have a severe headache.  You have persistent vomiting.  You  have pain or swelling around your face or eyes.  You have vision problems.  You develop confusion.  Your neck is stiff.  You have trouble breathing. This information is not intended to replace advice given to you by your health care provider. Make sure you discuss any questions you have with your health care provider. Document Released: 01/03/2005 Document Revised: 08/30/2015 Document Reviewed: 10/29/2014 [employer] Interactive Patient Education  2017 [employer]  Otitis Media, Adult Otitis media is redness, soreness, and puffiness (swelling) in the space just behind your eardrum (middle ear). It may be caused by allergies or infection. It often happens along with a cold. Follow these instructions at home:  Take your medicine as told. Finish it even if you start to feel better.  Only take over-the-counter or prescription medicines for pain, discomfort, or fever as told by your doctor.  Follow up with your doctor as told. Contact a doctor if:  You have otitis media only in one ear, or bleeding from your nose, or both.  You notice a lump on your neck.  You are not getting better in 3-5 days.  You feel worse instead of better. Get help right away if:  You have pain that is not helped with medicine.  You have puffiness, redness, or pain around your ear.  You get a stiff neck.  You cannot move part of your face (paralysis).  You notice that the bone behind your ear hurts when you touch it. This information is not intended to replace advice given to you by your health care provider. Make sure you discuss any questions you have with your health care provider. Document Released:  06/22/2007 Document Revised: 06/11/2015 Document Reviewed: 07/31/2012 [employer] Interactive Patient Education  2017 [employer]  Shoulder Sprain A shoulder sprain is a partial or complete tear in one of the tough, fiber-like tissues (ligaments) in the shoulder. The ligaments in the shoulder help to hold the shoulder in place. What are the causes? This condition may be caused by:  A fall.  A hit to the shoulder.  A twist of the arm.  What increases the risk? This condition is more likely to develop in:  People who play sports.  People who have problems with balance or coordination.  What are the signs or symptoms? Symptoms of this condition include:  Pain when moving the shoulder.  Limited ability to move the shoulder.  Swelling and tenderness on top of the shoulder.  Warmth in the shoulder.  A change in the shape of the shoulder.  Redness or bruising on the shoulder.  How is this diagnosed? This condition is diagnosed with a physical exam. During the exam, you may be asked to do simple exercises with your shoulder. You may also have imaging tests, such as X-rays, MRI, or a CT scan. These tests can show how severe the sprain is. How is this treated? This condition may be treated with:  Rest.  Pain medicine.  Ice.  A sling or brace. This is used to keep the arm still while the shoulder is healing.  Physical therapy or rehabilitation exercises. These help to improve the range of motion and strength of the shoulder.  Surgery (rare). Surgery may be needed if the sprain caused a joint to become unstable. Surgery may also be needed to reduce pain.  Some people may develop ongoing shoulder pain or lose some range of motion in the shoulder. However, most people do not develop long-term problems. Follow these instructions at home:  Rest.  Ask your health care provider when it is safe for you to drive if you have a sling or brace on your shoulder.  Take  over-the-counter and prescription medicines only as told by your health care provider.  If directed, apply ice to the area: ? Put ice in a plastic bag. ? Place a towel between your skin and the bag. ? Leave the ice on for 20 minutes, 2-3 times per day.  If you were given a shoulder sling or brace: ? Wear it as told. ? Remove it to shower or bathe. ? Move your arm only as much as told by your health care provider, but keep your hand moving to prevent swelling.  If you were shown how to do any exercises, do them as told by your health care provider.  Keep all follow-up visits as told by your health care provider. This is important. Contact a health care provider if:  Your pain gets worse.  Your pain is not relieved with medicines.  You have increased redness or swelling. Get help right away if:  You have a fever.  You cannot move your arm or shoulder.  You develop severe numbness or tingling in your arm, hand, or fingers.  Your arm, hand, or fingers turn blue, white, or gray and feel cold. This information is not intended to replace advice given to you by your health care provider. Make sure you discuss any questions you have with your health care provider. Document Released: 05/22/2008 Document Revised: 08/30/2015 Document Reviewed: 04/28/2014 [employer] Interactive Patient Education  2017 [employer] Contusion A contusion is a deep bruise. Contusions are the result of a blunt injury to tissues and muscle fibers under the skin. The injury causes bleeding under the skin. The skin overlying the contusion may turn blue, purple, or yellow. Minor injuries will give you a painless contusion, but more severe contusions may stay painful and swollen for a few weeks. What are the causes? This condition is usually caused by a blow, trauma, or direct force to an area of the body. What are the signs or symptoms? Symptoms of this condition include:  Swelling of the injured area.  Pain  and tenderness in the injured area.  Discoloration. The area may have redness and then turn blue, purple, or yellow.  How is this diagnosed? This condition is diagnosed based on a physical exam and medical history. An X-ray, CT scan, or MRI may be needed to determine if there are any associated injuries, such as broken bones (fractures). How is this treated? Specific treatment for this condition depends on what area of the body was injured. In general, the best treatment for a contusion is resting, icing, applying pressure to (compression), and elevating the injured area. This is often called the RICE strategy. Over-the-counter anti-inflammatory medicines may also be recommended for pain control. Follow these instructions at home:  Rest the injured area.  If directed, apply ice to the injured area: ? Put ice in a plastic bag. ? Place a towel between your skin and the bag. ? Leave the ice on for 20 minutes, 2-3 times per day.  If directed, apply light compression to the injured area using an elastic bandage. Make sure the bandage is not wrapped too tightly. Remove and reapply the bandage as directed by your health care provider.  If possible, raise (elevate) the injured area above the level of your heart while you are sitting or lying down.  Take over-the-counter and prescription medicines  only as told by your health care provider. Contact a health care provider if:  Your symptoms do not improve after several days of treatment.  Your symptoms get worse.  You have difficulty moving the injured area. Get help right away if:  You have severe pain.  You have numbness in a hand or foot.  Your hand or foot turns pale or cold. This information is not intended to replace advice given to you by your health care provider. Make sure you discuss any questions you have with your health care provider. Document Released: 10/13/2004 Document Revised: 05/14/2015 Document Reviewed:  05/21/2014 [employer] Interactive Patient Education  2017 [employer] Cervical Strain and Sprain Rehab Ask your health care provider which exercises are safe for you. Do exercises exactly as told by your health care provider and adjust them as directed. It is normal to feel mild stretching, pulling, tightness, or discomfort as you do these exercises, but you should stop right away if you feel sudden pain or your pain gets worse.Do not begin these exercises until told by your health care provider. Stretching and range of motion exercises These exercises warm up your muscles and joints and improve the movement and flexibility of your neck. These exercises also help to relieve pain, numbness, and tingling. Exercise A: Cervical side bend  1. Using good posture, sit on a stable chair or stand up. 2. Without moving your shoulders, slowly tilt your left / right ear to your shoulder until you feel a stretch in your neck muscles. You should be looking straight ahead. 3. Hold for __________ seconds. 4. Repeat with the other side of your neck. Repeat __________ times. Complete this exercise __________ times a day. Exercise B: Cervical rotation  1. Using good posture, sit on a stable chair or stand up. 2. Slowly turn your head to the side as if you are looking over your left / right shoulder. ? Keep your eyes level with the ground. ? Stop when you feel a stretch along the side and the back of your neck. 3. Hold for __________ seconds. 4. Repeat this by turning to your other side. Repeat __________ times. Complete this exercise __________ times a day. Exercise C: Thoracic extension and pectoral stretch 1. Roll a towel or a small blanket so it is about 4 inches (10 cm) in diameter. 2. Lie down on your back on a firm surface. 3. Put the towel lengthwise, under your spine in the middle of your back. It should not be not under your shoulder blades. The towel should line up with your spine from your middle  back to your lower back. 4. Put your hands behind your head and let your elbows fall out to your sides. 5. Hold for __________ seconds. Repeat __________ times. Complete this exercise __________ times a day. Strengthening exercises These exercises build strength and endurance in your neck. Endurance is the ability to use your muscles for a long time, even after your muscles get tired. Exercise D: Upper cervical flexion, isometric 1. Lie on your back with a thin pillow behind your head and a small rolled-up towel under your neck. 2. Gently tuck your chin toward your chest and nod your head down to look toward your feet. Do not lift your head off the pillow. 3. Hold for __________ seconds. 4. Release the tension slowly. Relax your neck muscles completely before you repeat this exercise. Repeat __________ times. Complete this exercise __________ times a day. Exercise E: Cervical extension, isometric  1.  Stand about 6 inches (15 cm) away from a wall, with your back facing the wall. 2. Place a soft object, about 6-8 inches (15-20 cm) in diameter, between the back of your head and the wall. A soft object could be a small pillow, a ball, or a folded towel. 3. Gently tilt your head back and press into the soft object. Keep your jaw and forehead relaxed. 4. Hold for __________ seconds. 5. Release the tension slowly. Relax your neck muscles completely before you repeat this exercise. Repeat __________ times. Complete this exercise __________ times a day. Posture and body mechanics  Body mechanics refers to the movements and positions of your body while you do your daily activities. Posture is part of body mechanics. Good posture and healthy body mechanics can help to relieve stress in your body's tissues and joints. Good posture means that your spine is in its natural S-curve position (your spine is neutral), your shoulders are pulled back slightly, and your head is not tipped forward. The following are  general guidelines for applying improved posture and body mechanics to your everyday activities. Standing  When standing, keep your spine neutral and keep your feet about hip-width apart. Keep a slight bend in your knees. Your ears, shoulders, and hips should line up.  When you do a task in which you stand in one place for a long time, place one foot up on a stable object that is 2-4 inches (5-10 cm) high, such as a footstool. This helps keep your spine neutral. Sitting   When sitting, keep your spine neutral and your keep feet flat on the floor. Use a footrest, if necessary, and keep your thighs parallel to the floor. Avoid rounding your shoulders, and avoid tilting your head forward.  When working at a desk or a computer, keep your desk at a height where your hands are slightly lower than your elbows. Slide your chair under your desk so you are close enough to maintain good posture.  When working at a computer, place your monitor at a height where you are looking straight ahead and you do not have to tilt your head forward or downward to look at the screen. Resting When lying down and resting, avoid positions that are most painful for you. Try to support your neck in a neutral position. You can use a contour pillow or a small rolled-up towel. Your pillow should support your neck but not push on it. This information is not intended to replace advice given to you by your health care provider. Make sure you discuss any questions you have with your health care provider. Document Released: 01/03/2005 Document Revised: 09/10/2015 Document Reviewed: 12/10/2014 Elsevier Interactive Patient Education  [employer]2018 Elsevier Inc.
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
## 2016-09-28 ENCOUNTER — Encounter: Payer: Self-pay | Admitting: Physician Assistant

## 2016-09-28 ENCOUNTER — Ambulatory Visit (INDEPENDENT_AMBULATORY_CARE_PROVIDER_SITE_OTHER): Payer: No Typology Code available for payment source | Admitting: Physician Assistant

## 2016-09-28 VITALS — BP 96/60 | HR 68 | Temp 98.5°F | Resp 17 | Ht 68.5 in | Wt 140.0 lb

## 2016-09-28 DIAGNOSIS — S060X9D Concussion with loss of consciousness of unspecified duration, subsequent encounter: Secondary | ICD-10-CM | POA: Diagnosis not present

## 2016-09-28 NOTE — Patient Instructions (Signed)
     IF you received an x-ray today, you will receive an invoice from [facility]. Please contact [facility] at [phone] with questions or concerns regarding your invoice.   IF you received labwork today, you will receive an invoice from [facility]. Please contact [facility] at [phone] with questions or concerns regarding your invoice.   Our billing staff will not be able to assist you with questions regarding bills from these companies.  You will be contacted with the lab results as soon as they are available. The fastest way to get your results is to activate your My Chart account. Instructions are located on the last page of this paperwork. If you have not heard from us regarding the results in 2 weeks, please contact this office.     

## 2016-09-28 NOTE — Progress Notes (Signed)
    09/28/2016 2:57 PM   DOB: 09/11/1965 / MRN: [id number]  SUBJECTIVE:  Gina Mckenzie is a 51 y.o. female presenting for dizziness and HA that started upon going back to work after a head injury that I previously evaluated her for.  I kept her out of work for about 4 days and these symtpoms started  Back after returning to work.  No weakness.  Assoicates photophobia, phonophobia, denies HA.  No emesis.   She has No Known Allergies.   She  has no past medical history on file.    She  reports that she has been smoking Cigarettes.  She has been smoking about 0.50 packs per day. She has never used smokeless tobacco. She reports that she does not drink alcohol or use drugs. She  has no sexual activity history on file. The patient  has no past surgical history on file.  Her family history is not on file.  Review of Systems  Constitutional: Negative for chills, diaphoresis and fever.  Eyes: Negative.   Respiratory: Negative for shortness of breath.   Cardiovascular: Negative for chest pain, orthopnea and leg swelling.  Gastrointestinal: Negative for nausea.  Skin: Negative for rash.  Neurological: Negative for dizziness, sensory change, speech change, focal weakness and headaches.    The problem list and medications were reviewed and updated by myself where necessary and exist elsewhere in the encounter.   OBJECTIVE:  BP 96/60   Pulse 68   Temp 98.5 F (36.9 C) (Oral)   Resp 17   Ht 5' 8.5" (1.74 m)   Wt 140 lb (63.5 kg)   SpO2 98%   BMI 20.98 kg/m   Physical Exam  Constitutional: She is oriented to person, place, and time. She is active.  Non-toxic appearance.  Cardiovascular: Normal rate, regular rhythm, S1 normal, S2 normal, normal heart sounds and intact distal pulses.  Exam reveals no gallop, no friction rub and no decreased pulses.   No murmur heard. Pulmonary/Chest: Effort normal. No stridor. No tachypnea. No respiratory distress. She has no wheezes. She has no rales.    Abdominal: She exhibits no distension.  Musculoskeletal: She exhibits no edema.  Neurological: She is alert and oriented to person, place, and time. She has normal reflexes. She displays normal reflexes. No cranial nerve deficit. She exhibits normal muscle tone. Coordination normal.  Skin: Skin is warm and dry. She is not diaphoretic. No pallor.  Psychiatric: She has a normal mood and affect. Her behavior is normal. Judgment and thought content normal.    No results found for this or any previous visit (from the past 72 hour(s)).  No results found.  ASSESSMENT AND PLAN:  [name] was seen today for dizziness and nausea.  Diagnoses and all orders for this visit:  Concussion with loss of consciousness, subsequent encounter: Strict brainrest for the next 7 days.  No work.  Work note written.     The patient is advised to call or return to clinic if she does not see an improvement in symptoms, or to seek the care of the closest emergency department if she worsens with the above plan.   Deliah BostonMichael Trinty Marken, MHS, PA-C [facility] 09/28/2016 2:57 PM

## 2016-09-29 ENCOUNTER — Encounter: Payer: Self-pay | Admitting: Registered Nurse

## 2016-09-29 ENCOUNTER — Telehealth: Payer: Self-pay | Admitting: Registered Nurse

## 2016-09-29 NOTE — Telephone Encounter (Signed)
Papers given to McVey to complete

## 2016-09-29 NOTE — Telephone Encounter (Signed)
Patient not at work today.  Follow up from 09/27/2016 concussion.  Called patient at home she [facility] and FMLA paperwork completed to return to work on 10/07/2016.  HR Rep [employer] notified.  Patient reported she is still having headache, nausea and neck pain but it is improving.  Patient had no further questions at this time.

## 2016-10-03 NOTE — Telephone Encounter (Signed)
Paperwork scanned and faxed on 10/03/16

## 2017-02-16 ENCOUNTER — Telehealth: Payer: Self-pay

## 2017-02-16 NOTE — Telephone Encounter (Signed)
Copied from CRM [id number]. Topic: Inquiry >> Feb 16, 2017 10:50 AM Arlyss Gandyichardson, Taren N, NT wrote: Reason for CRM: Christiane HaJonathan from [employer] calling to check the status on receiving billing records for this pt. Informed him of the 5-7 business day turnaround, he states today has been 10 business days. Please contact Christiane HaJonathan at [phone] Ref#: [id number]  >> Feb 16, 2017 12:18 PM Diana EvesHoyt, Maryann B wrote: [employer] is wanting to confirm that the fax where received at the office [phone] Cite REF# [id number]

## 2017-04-06 ENCOUNTER — Ambulatory Visit: Payer: Self-pay | Admitting: Registered Nurse

## 2017-04-06 NOTE — Progress Notes (Signed)
   Subjective:    Patient ID: Gina OpitzVESNA Mckenzie, female    DOB: 02/26/1965, 52 y.o.   MRN: [id number]  HPI    Review of Systems     Objective:   Physical Exam        Assessment & Plan:

## 2017-04-07 ENCOUNTER — Ambulatory Visit: Payer: Self-pay | Admitting: *Deleted

## 2017-04-07 VITALS — BP 98/59 | HR 70 | Temp 98.3°F

## 2017-04-07 DIAGNOSIS — H6591 Unspecified nonsuppurative otitis media, right ear: Secondary | ICD-10-CM

## 2017-04-07 MED ORDER — FLUTICASONE PROPIONATE 50 MCG/ACT NA SUSP: 1.0000 | Freq: Two times a day (BID) | NASAL | 6 refills | Status: DC

## 2017-04-07 NOTE — Progress Notes (Signed)
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 Sx lasted 30 min. Denies n/v, diaphoresis, etc. Coworkers thought her BP might be low so she drank a Mt dew and felt better later but sx did not completely resolve. Would have "wooziness" or room spinning sensation if she stood up too fast or turned her head too quickly. Feels much better today but is avoiding known triggers of sx- sudden position changes, quick head turns.  Denies sinus pain/pressure, cough, nasal or chest congestion, sore throat, fever. Endorses some R ear pressure and sensation that it needs to "pop" over past week. R TM with erythema, air fluid level, cloudy. L TM nonerythematous, 50% air fluid level, clear fluid. No perforation noted bilaterally.   Advised pt to restart Flonase 1 spray each nostril BID as she normally uses this seasonally. Rx placed to preferred pharmacy under standing orders. Nasal saline spray given from clinic stock for use q2h while awake and as nasal rinse in shower. Encouraged to rtc on Monday for re-eval if sx persists for appt with NP on Tuesday. Pt agreeable to this. No further questions or concerns.

## 2017-04-12 NOTE — Progress Notes (Signed)
No show

## 2017-04-13 ENCOUNTER — Ambulatory Visit: Payer: Self-pay | Admitting: Registered Nurse

## 2017-04-13 VITALS — BP 99/63 | HR 69 | Temp 97.0°F | Resp 18

## 2017-04-13 DIAGNOSIS — H65191 Other acute nonsuppurative otitis media, right ear: Secondary | ICD-10-CM

## 2017-04-13 DIAGNOSIS — H8111 Benign paroxysmal vertigo, right ear: Secondary | ICD-10-CM

## 2017-04-13 DIAGNOSIS — J019 Acute sinusitis, unspecified: Secondary | ICD-10-CM

## 2017-04-13 NOTE — Progress Notes (Signed)
Subjective:    Patient ID: Gina Mckenzie, female    DOB: 1965/08/14, 52 y.o.   MRN: [id number]  52y/o caucasian female established patient here for evaluation of right ear pain, intermittent dizzyness with head position changes especially bending over and turning head/lifting boxes from floor at work.  Has decreased in frequency since starting flonase and nasal saline.  PMHx seasonal allergies recently restarted nasal saline and flonase helping with rhinitis.  Some upper teeth pain denied cavities or dental problems.  + sick contacts at work.     Review of Systems  Constitutional: Negative for activity change, appetite change, chills, diaphoresis, fatigue, fever and unexpected weight change.  HENT: Positive for ear pain, postnasal drip, rhinorrhea, sinus pressure and sinus pain. Negative for congestion, dental problem, drooling, ear discharge, facial swelling, hearing loss, mouth sores, nosebleeds, sneezing, sore throat, tinnitus, trouble swallowing and voice change.   Eyes: Negative for photophobia, pain, discharge, redness, itching and visual disturbance.  Respiratory: Negative for cough, choking, chest tightness, shortness of breath, wheezing and stridor.   Cardiovascular: Negative for chest pain, palpitations and leg swelling.  Gastrointestinal: Negative for abdominal distention, abdominal pain, blood in stool, diarrhea, nausea and vomiting.  Endocrine: Negative for cold intolerance and heat intolerance.  Genitourinary: Negative for difficulty urinating.  Musculoskeletal: Negative for arthralgias, back pain, gait problem, joint swelling, myalgias, neck pain and neck stiffness.  Skin: Negative for color change, pallor, rash and wound.  Allergic/Immunologic: Positive for environmental allergies. Negative for food allergies.  Neurological: Positive for dizziness. Negative for tremors, seizures, syncope, facial asymmetry, speech difficulty, weakness, light-headedness, numbness and headaches.   Hematological: Negative for adenopathy. Does not bruise/bleed easily.  Psychiatric/Behavioral: Negative for agitation, confusion and sleep disturbance.       Objective:   Physical Exam  Constitutional: She is oriented to person, place, and time. Vital signs are normal. She appears well-developed and well-nourished. She is active and cooperative.  Non-toxic appearance. She does not have a sickly appearance. She appears ill. No distress.  HENT:  Head: Normocephalic and atraumatic.  Right Ear: Hearing, external ear and ear canal normal. Tympanic membrane is erythematous and bulging. A middle ear effusion is present.  Left Ear: Hearing, external ear and ear canal normal. A middle ear effusion is present.  Nose: Mucosal edema and rhinorrhea present. No nose lacerations, sinus tenderness, nasal deformity, septal deviation or nasal septal hematoma. No epistaxis.  No foreign bodies. Right sinus exhibits no maxillary sinus tenderness and no frontal sinus tenderness. Left sinus exhibits no maxillary sinus tenderness and no frontal sinus tenderness.  Mouth/Throat: Uvula is midline and mucous membranes are normal. Mucous membranes are not pale, not dry and not cyanotic. She has dentures. No oral lesions. No trismus in the jaw. Normal dentition. No dental abscesses, uvula swelling, lacerations or dental caries. Posterior oropharyngeal edema and posterior oropharyngeal erythema present. No oropharyngeal exudate or tonsillar abscesses.  Cobblestoning posterior pharynx; bilateral allergic shiners; bilateral TMs air fluid level clear; bilateral nasal turbinates edema/erythema clear discharge scant; upper dentures; Right TM bulging and 5% redness centrally  Eyes: Pupils are equal, round, and reactive to light. Conjunctivae, EOM and lids are normal. Right eye exhibits no chemosis, no discharge, no exudate and no hordeolum. No foreign body present in the right eye. Left eye exhibits no chemosis, no discharge, no  exudate and no hordeolum. No foreign body present in the left eye. Right conjunctiva is not injected. Right conjunctiva has no hemorrhage. Left conjunctiva is not injected. Left conjunctiva has  no hemorrhage. No scleral icterus. Right eye exhibits normal extraocular motion and no nystagmus. Left eye exhibits normal extraocular motion and no nystagmus. Right pupil is round and reactive. Left pupil is round and reactive. Pupils are equal.  Neck: Trachea normal, normal range of motion and phonation normal. Neck supple. No tracheal tenderness and no muscular tenderness present. No neck rigidity. No tracheal deviation, no edema, no erythema and normal range of motion present. No thyroid mass and no thyromegaly present.  Cardiovascular: Normal rate, regular rhythm, S1 normal, S2 normal, normal heart sounds and intact distal pulses. PMI is not displaced. Exam reveals no gallop, no distant heart sounds and no friction rub.  No murmur heard. Pulmonary/Chest: Effort normal and breath sounds normal. No accessory muscle usage or stridor. No respiratory distress. She has no decreased breath sounds. She has no wheezes. She has no rhonchi. She has no rales. She exhibits no tenderness.  No cough observed in exam room; spoke full sentences without difficulty  Abdominal: Soft. Normal appearance. She exhibits no distension, no fluid wave and no ascites. There is no tenderness. There is no rigidity and no guarding.  Musculoskeletal: Normal range of motion. She exhibits no edema or tenderness.       Right shoulder: Normal.       Left shoulder: Normal.       Right elbow: Normal.      Left elbow: Normal.       Right hip: Normal.       Left hip: Normal.       Right knee: Normal.       Left knee: Normal.       Cervical back: Normal.       Thoracic back: Normal.       Lumbar back: Normal.       Right hand: Normal.       Left hand: Normal.  Lymphadenopathy:       Head (right side): No submental, no submandibular, no  tonsillar, no preauricular, no posterior auricular and no occipital adenopathy present.       Head (left side): No submental, no submandibular, no tonsillar, no preauricular, no posterior auricular and no occipital adenopathy present.    She has no cervical adenopathy.       Right cervical: No superficial cervical, no deep cervical and no posterior cervical adenopathy present.      Left cervical: No superficial cervical, no deep cervical and no posterior cervical adenopathy present.  Neurological: She is alert and oriented to person, place, and time. She has normal strength. She is not disoriented. She displays no atrophy and no tremor. No cranial nerve deficit or sensory deficit. She exhibits normal muscle tone. She displays no seizure activity. Coordination and gait normal. GCS eye subscore is 4. GCS verbal subscore is 5. GCS motor subscore is 6.  On/off exam table; in/out of chair without difficulty; gait sure and steady in hallway  Skin: Skin is warm, dry and intact. No abrasion, no bruising, no burn, no ecchymosis, no laceration, no lesion, no petechiae and no rash noted. She is not diaphoretic. No cyanosis or erythema. No pallor. Nails show no clubbing.  Psychiatric: She has a normal mood and affect. Her speech is normal and behavior is normal. Judgment and thought content normal. Cognition and memory are normal.  Nursing note and vitals reviewed.         Assessment & Plan:  A-acute rhinosinusitis and bilateral otitis media, vertigo  P-amoxicillin 875mg  po BID x 10  days #20 RF0 dispensed from [facility].  Supportive treatment.   No evidence of invasive bacterial infection, non toxic and well hydrated.  This is most likely self limiting viral infection.  I do not see where any further testing or imaging is necessary at this time.   I will suggest supportive care, rest, good hygiene and encourage the patient to take adequate fluids.  The patient is to return to clinic or EMERGENCY ROOM if symptoms  worsen or change significantly e.g. ear pain, fever, purulent discharge from ears or bleeding.  Exitcare handout on otitis media given to patient.  Patient verbalized agreement and understanding of treatment plan and had no further questions at this time.  Discussed with patient otitis media with effusion probably causing vertigo but could also be age.OTC dramamine (active ingredient meclizine) 25mg  po QID prn vertigo--patient refused Rx.  Supportive treatment may take up to 4 doses meclizine per day max 100mg  per 24 hours. Discussed signs/symptoms stroke. Recommended not driving during vertigo episodes. Follow up if aphasia, dysphasia, visual changes, weakness, fall, worst headache of life, incoordination, fever, ear discharge. Consider ENT evaluation/follow up with PCM if worsening symptoms not controlled with meclizine or needs Rx refill. Patient verbalized understanding of information/agreed with plan of care and had no further questions at this time.   Continue flonase 1 spray each nostril BID #1 RF6 electronic Rx.  Patient denied personal or family history of ENT cancer.  OTC antihistamine of choice claritin/zyrtec 10mg  po daily.  Avoid triggers if possible.  Shower prior to bedtime if exposed to triggers.  If allergic dust/dust mites recommend mattress/pillow covers/encasements; washing linens, vacuuming, sweeping, dusting weekly.  Call or return to clinic as needed if these symptoms worsen or fail to improve as anticipated.   Exitcare handout on allergic rhinitis and sinus rinse given to patient.  Patient verbalized understanding of instructions, agreed with plan of care and had no further questions at this time.  P2:  Avoidance and hand washing.  Continue flonase 1 spray each nostril BID, saline 2 sprays each nostril q2h wa prn congestion.  If no improvement with 48 hours of saline and flonase use start amoxicillin 875mg  po BID x 10 days #20 RF0 dispensed from [facility].  Denied personal or family  history of ENT cancer.  Shower BID especially prior to bed. No evidence of systemic bacterial infection, non toxic and well hydrated.  I do not see where any further testing or imaging is necessary at this time.   I will suggest supportive care, rest, good hygiene and encourage the patient to take adequate fluids.  The patient is to return to clinic or EMERGENCY ROOM if symptoms worsen or change significantly.  Exitcare handout on sinusitis and sinus rinse given to patient.  Patient verbalized agreement and understanding of treatment plan and had no further questions at this time.   P2:  Hand washing and cover cough

## 2017-04-14 ENCOUNTER — Encounter: Payer: Self-pay | Admitting: Registered Nurse

## 2017-04-14 MED ORDER — ACETAMINOPHEN 500 MG PO TABS: 1000.0000 mg | ORAL_TABLET | Freq: Four times a day (QID) | ORAL | 0 refills | Status: AC | PRN

## 2017-04-14 MED ORDER — SALINE SPRAY 0.65 % NA SOLN: 2.0000 | NASAL | 0 refills | Status: DC

## 2017-04-14 MED ORDER — AMOXICILLIN 875 MG PO TABS: 875.0000 mg | ORAL_TABLET | Freq: Two times a day (BID) | ORAL | 0 refills | Status: DC

## 2017-04-14 MED ORDER — MECLIZINE HCL 25 MG PO TABS: 25.0000 mg | ORAL_TABLET | Freq: Four times a day (QID) | ORAL | 0 refills | Status: AC | PRN

## 2017-04-14 NOTE — Patient Instructions (Signed)
Otitis Media, Adult Otitis media occurs when there is inflammation and fluid in the middle ear. Your middle ear is a part of the ear that contains bones for hearing as well as air that helps send sounds to your brain. What are the causes? This condition is caused by a blockage in the eustachian tube. This tube drains fluid from the ear to the back of the nose (nasopharynx). A blockage in this tube can be caused by an object or by swelling (edema) in the tube. Problems that can cause a blockage include:  A cold or other upper respiratory infection.  Allergies.  An irritant, such as tobacco smoke.  Enlarged adenoids. The adenoids are areas of soft tissue located high in the back of the throat, behind the nose and the roof of the mouth.  A mass in the nasopharynx.  Damage to the ear caused by pressure changes (barotrauma).  What are the signs or symptoms? Symptoms of this condition include:  Ear pain.  A fever.  Decreased hearing.  A headache.  Tiredness (lethargy).  Fluid leaking from the ear.  Ringing in the ear.  How is this diagnosed? This condition is diagnosed with a physical exam. During the exam your health care provider will use an instrument called an otoscope to look into your ear and check for redness, swelling, and fluid. He or she will also ask about your symptoms. Your health care provider may also order tests, such as:  A test to check the movement of the eardrum (pneumatic otoscopy). This test is done by squeezing a small amount of air into the ear.  A test that changes air pressure in the middle ear to check how well the eardrum moves and whether the eustachian tube is working (tympanogram).  How is this treated? This condition usually goes away on its own within 3-5 days. But if the condition is caused by a bacteria infection and does not go away own its own, or keeps coming back, your health care provider may:  Prescribe antibiotic medicines to treat the  infection.  Prescribe or recommend medicines to control pain.  Follow these instructions at home:  Take over-the-counter and prescription medicines only as told by your health care provider.  If you were prescribed an antibiotic medicine, take it as told by your health care provider. Do not stop taking the antibiotic even if you start to feel better.  Keep all follow-up visits as told by your health care provider. This is important. Contact a health care provider if:  You have bleeding from your nose.  There is a lump on your neck.  You are not getting better in 5 days.  You feel worse instead of better. Get help right away if:  You have severe pain that is not controlled with medicine.  You have swelling, redness, or pain around your ear.  You have stiffness in your neck.  A part of your face is paralyzed.  The bone behind your ear (mastoid) is tender when you touch it.  You develop a severe headache. Summary  Otitis media is redness, soreness, and swelling of the middle ear.  This condition usually goes away on its own within 3-5 days.  If the problem does not go away in 3-5 days, your health care provider may prescribe or recommend medicines to treat your symptoms.  If you were prescribed an antibiotic medicine, take it as told by your health care provider. This information is not intended to replace advice given   to you by your health care provider. Make sure you discuss any questions you have with your health care provider. Document Released: 10/09/2003 Document Revised: 12/25/2015 Document Reviewed: 12/25/2015 Elsevier Interactive Patient Education  2018 [employer] Vertigo Vertigo is the feeling that you or your surroundings are moving when they are not. Vertigo can be dangerous if it occurs while you are doing something that could endanger you or others, such as driving. What are the causes? This condition is caused by a disturbance in the signals that are  sent by your body's sensory systems to your brain. Different causes of a disturbance can lead to vertigo, including:  Infections, especially in the inner ear.  A bad reaction to a drug, or misuse of alcohol and medicines.  Withdrawal from drugs or alcohol.  Quickly changing positions, as when lying down or rolling over in bed.  Migraine headaches.  Decreased blood flow to the brain.  Decreased blood pressure.  Increased pressure in the brain from a head or neck injury, stroke, infection, tumor, or bleeding.  Central nervous system disorders.  What are the signs or symptoms? Symptoms of this condition usually occur when you move your head or your eyes in different directions. Symptoms may start suddenly, and they usually last for less than a minute. Symptoms may include:  Loss of balance and falling.  Feeling like you are spinning or moving.  Feeling like your surroundings are spinning or moving.  Nausea and vomiting.  Blurred vision or double vision.  Difficulty hearing.  Slurred speech.  Dizziness.  Involuntary eye movement (nystagmus).  Symptoms can be mild and cause only slight annoyance, or they can be severe and interfere with daily life. Episodes of vertigo may return (recur) over time, and they are often triggered by certain movements. Symptoms may improve over time. How is this diagnosed? This condition may be diagnosed based on medical history and the quality of your nystagmus. Your health care provider may test your eye movements by asking you to quickly change positions to trigger the nystagmus. This may be called the Dix-Hallpike test, head thrust test, or roll test. You may be referred to a health care provider who specializes in ear, nose, and throat (ENT) problems (otolaryngologist) or a provider who specializes in disorders of the central nervous system (neurologist). You may have additional testing, including:  A physical exam.  Blood  tests.  MRI.  A CT scan.  An electrocardiogram (ECG). This records electrical activity in your heart.  An electroencephalogram (EEG). This records electrical activity in your brain.  Hearing tests.  How is this treated? Treatment for this condition depends on the cause and the severity of the symptoms. Treatment options include:  Medicines to treat nausea or vertigo. These are usually used for severe cases. Some medicines that are used to treat other conditions may also reduce or eliminate vertigo symptoms. These include: ? Medicines that control allergies (antihistamines). ? Medicines that control seizures (anticonvulsants). ? Medicines that relieve depression (antidepressants). ? Medicines that relieve anxiety (sedatives).  Head movements to adjust your inner ear back to normal. If your vertigo is caused by an ear problem, your health care provider may recommend certain movements to correct the problem.  Surgery. This is rare.  Follow these instructions at home: Safety  Move slowly.Avoid sudden body or head movements.  Avoid driving.  Avoid operating heavy machinery.  Avoid doing any tasks that would cause danger to you or others if you would have a vertigo episode during   the task.  If you have trouble walking or keeping your balance, try using a cane for stability. If you feel dizzy or unstable, sit down right away.  Return to your normal activities as told by your health care provider. Ask your health care provider what activities are safe for you. General instructions  Take over-the-counter and prescription medicines only as told by your health care provider.  Avoid certain positions or movements as told by your health care provider.  Drink enough fluid to keep your urine clear or pale yellow.  Keep all follow-up visits as told by your health care provider. This is important. Contact a health care provider if:  Your medicines do not relieve your vertigo or they  make it worse.  You have a fever.  Your condition gets worse or you develop new symptoms.  Your family or friends notice any behavioral changes.  Your nausea or vomiting gets worse.  You have numbness or a "pins and needles" sensation in part of your body. Get help right away if:  You have difficulty moving or speaking.  You are always dizzy.  You faint.  You develop severe headaches.  You have weakness in your hands, arms, or legs.  You have changes in your hearing or vision.  You develop a stiff neck.  You develop sensitivity to light. This information is not intended to replace advice given to you by your health care provider. Make sure you discuss any questions you have with your health care provider. Document Released: 10/13/2004 Document Revised: 06/17/2015 Document Reviewed: 04/28/2014 Elsevier Interactive Patient Education  2018 [employer]  

## 2017-05-04 ENCOUNTER — Telehealth: Payer: Self-pay | Admitting: Physician Assistant
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
## 2017-05-05 NOTE — Telephone Encounter (Signed)
Left detailed VM for pt. These are not the dates listed on the work note. We do no usually provide medical information on these notes and advised to call back for further clarification on this.

## 2017-05-06 ENCOUNTER — Encounter: Payer: Self-pay | Admitting: *Deleted
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
## 2020-07-08 ENCOUNTER — Telehealth: Payer: Self-pay | Admitting: Registered Nurse

## 2020-07-08 ENCOUNTER — Encounter: Payer: Self-pay | Admitting: Registered Nurse

## 2020-07-08 NOTE — Telephone Encounter (Signed)
Patient reported she woke up with upset stomach.  Called her supervisor to say she was staying home.  "I think I ate something bad"  Denied fever/chills/cough/congestion/sore throat/dyspnea/shortness of breath.  Symptoms have resolved for a couple hours and she feels she can go back to work tomorrow.  Patient A&Ox3 spoke full sentences without difficulty  No cough/nasal congestion/throat clearing noted during 2 minute telephone call.  I have recommended clear fluids and advanced to soft as tolerated.  Avoid dairy, spicy and fried foods until diarrhea resolves. Avoid dehydration drink noncaffeinated beverages (water, ginger ale, soup broth, popsicles, no sugar added gatorade/powerade) to urinate every 2-4 hours pale yellow urine.  It is easy to become dehydrated when having diarrhea along with electrolyte imbalances.  Contact clinic staff if symptoms reoccur  HR will be notified she is cleared to return onsite tomorrow as long as no recurrence symptoms today or new symptoms.  HR notified cleared to return onsite tomorrow and she will call out sick if symptoms reoccur and contact clinic staff.  Patient verbalized agreement and understanding of treatment plan and had no further questions at this time. P2: Hand washing and fitness

## 2020-07-09 NOTE — Telephone Encounter (Signed)
Pt did RTW today 6/23 as expected. Closing encounter.

## 2020-07-09 NOTE — Telephone Encounter (Signed)
Noted patient returned onsite as expected today and symptoms resolved.  Reviewed RN Rolly Salter note.

## 2020-07-28 ENCOUNTER — Other Ambulatory Visit: Payer: Self-pay

## 2020-07-28 ENCOUNTER — Encounter: Payer: Self-pay | Admitting: Registered Nurse

## 2020-07-28 ENCOUNTER — Ambulatory Visit: Payer: Self-pay | Admitting: Registered Nurse

## 2020-07-28 VITALS — BP 117/67 | HR 68 | Temp 98.0°F

## 2020-07-28 DIAGNOSIS — L299 Pruritus, unspecified: Secondary | ICD-10-CM

## 2020-07-28 DIAGNOSIS — S29019A Strain of muscle and tendon of unspecified wall of thorax, initial encounter: Secondary | ICD-10-CM

## 2020-07-28 MED ORDER — IBUPROFEN 800 MG PO TABS: 800.0000 mg | ORAL_TABLET | Freq: Three times a day (TID) | ORAL | 0 refills | Status: AC | PRN

## 2020-07-28 MED ORDER — AQUAPHOR EX OINT: TOPICAL_OINTMENT | Freq: Two times a day (BID) | CUTANEOUS | 0 refills | Status: AC | PRN

## 2020-07-28 MED ORDER — DIPHENHYDRAMINE HCL 2 % EX GEL: 1.0000 "application " | Freq: Two times a day (BID) | CUTANEOUS | Status: AC | PRN

## 2020-07-28 MED ORDER — CETIRIZINE HCL 10 MG PO TABS: 10.0000 mg | ORAL_TABLET | Freq: Two times a day (BID) | ORAL | 0 refills | Status: DC | PRN

## 2020-07-28 MED ORDER — HYDROCORTISONE 1 % EX LOTN: 1.0000 "application " | TOPICAL_LOTION | Freq: Two times a day (BID) | CUTANEOUS | 0 refills | Status: AC

## 2020-07-28 NOTE — Patient Instructions (Addendum)
Shingles  Shingles, which is also known as herpes zoster, is an infection that causes apainful skin rash and fluid-filled blisters. It is caused by a virus. Shingles only develops in people who: Have had chickenpox. Have been vaccinated against chickenpox. Shingles is rare in this group. What are the causes? Shingles is caused by varicella-zoster virus. This is the same virus that causes chickenpox. After a person is exposed to the virus, it stays in the body in an inactive (dormant) state. Shingles develops if the virus is reactivated. This can happen many years after the first (initial) exposure to the virus. It is not known what causes this virus to bereactivated. What increases the risk? People who have had chickenpox or received the chickenpox vaccine are at risk for shingles. Shingles infection is more common in people who: Are older than 53 years of age. Have a weakened disease-fighting system (immune system), such as people with: HIV (human immunodeficiency virus). AIDS (acquired immunodeficiency syndrome). Cancer. Are taking medicines that weaken the immune system, such as organ transplant medicines. Are experiencing a lot of stress. What are the signs or symptoms? Early symptoms of this condition include itching, tingling, and pain in an areaon your skin. Pain may be described as burning, stabbing, or throbbing. A few days or weeks after early symptoms start, a painful red rash appears. The rash is usually on one side of the body and has a band-like or belt-like pattern. The rash eventually turns into fluid-filled blisters that break open,change into scabs, and dry up in about 2-3 weeks. At any time during the infection, you may also develop: A fever. Chills. A headache. Nausea. How is this diagnosed? This condition is diagnosed with a skin exam. Skin or fluid samples (a culture) may be taken from the blisters before a diagnosis is made. How is this treated? The rash may last  for several weeks. There is not a specific cure for this condition. Your health care provider may prescribe medicines to help you manage pain, recover more quickly, and avoid long-term problems. Medicines may include: Antiviral medicines. Anti-inflammatory medicines. Pain medicines. Anti-itching medicines (antihistamines). If the area involved is on your face, you may be referred to a specialist, such as an eye doctor (ophthalmologist) or an ear, nose, and throat (ENT) doctor (otorhinolaryngologist) to help you avoid eye problems, chronic pain, or disability. Follow these instructions at home: Medicines Take over-the-counter and prescription medicines only as told by your health care provider. Apply an anti-itch cream or numbing cream to the affected area as told by your health care provider. Relieving itching and discomfort  Apply cold, wet cloths (cold compresses) to the area of the rash or blisters as told by your health care provider. Cool baths can be soothing. Try adding baking soda or dry oatmeal to the water to reduce itching. Do not bathe in hot water. Use calamine lotion as recommended by your health care provider. This is an over-the-counter lotion that helps to relieve itchiness.  Blister and rash care Keep your rash covered with a loose bandage (dressing). Wear loose-fitting clothing to help ease the pain of material rubbing against the rash. Wash your hands with soap and water for at least 20 seconds before and after you change your dressing. If soap and water are not available, use hand sanitizer. Change your dressing as told by your health care provider. Keep your rash and blisters clean by washing the area with mild soap and cool water as told by your health care provider.  Check your rash every day for signs of infection. Check for: More redness, swelling, or pain. Fluid or blood. Warmth. Pus or a bad smell. Do not scratch your rash or pick at your blisters. To help avoid  scratching: Keep your fingernails clean and cut short. Wear gloves or mittens while you sleep, if scratching is a problem. General instructions Rest as told by your health care provider. Wash your hands often with soap and water for at least 20 seconds. If soap and water are not available, use hand sanitizer. Doing this lowers your chance of getting a bacterial skin infection. Before your blisters change into scabs, your shingles infection can cause chickenpox in people who have never had it or have never been vaccinated against it. To prevent this from happening, avoid contact with other people, especially: Babies. Pregnant women. Children who have eczema. Older people who have transplants. People who have chronic illnesses, such as cancer or AIDS. Keep all follow-up visits. This is important. How is this prevented? Getting vaccinated is the best way to prevent shingles and protect against shingles complications. If you have not been vaccinated, talk with your healthcare provider about getting the vaccine. Where to find more information Centers for Disease Control and Prevention: [employer]com.brwww.cdc.gov Contact a health care provider if: Your pain is not relieved with prescribed medicines. Your pain does not get better after the rash heals. You have any of these signs of infection: More redness, swelling, or pain around the rash. Fluid or blood coming from the rash. Warmth coming from your rash. Pus or a bad smell coming from the rash. A fever. Get help right away if: The rash is on your face or nose. You have facial pain, pain around your eye area, or loss of feeling on one side of your face. You have difficulty seeing. You have ear pain or have ringing in your ear. You have a loss of taste. Your condition gets worse. Summary Shingles, also known as herpes zoster, is an infection that causes a painful skin rash and fluid-filled blisters. This condition is diagnosed with a skin exam. Skin or  fluid samples (a culture) may be taken from the blisters. Keep your rash covered with a loose bandage (dressing). Wear loose-fitting clothing to help ease the pain of material rubbing against the rash. Before your blisters change into scabs, your shingles infection can cause chickenpox in people who have never had it or have never been vaccinated against it. This information is not intended to replace advice given to you by your health care provider. Make sure you discuss any questions you have with your healthcare provider. Document Revised: 12/30/2019 Document Reviewed: 12/30/2019 Elsevier Patient Education  2022 [employer] Rash, Adult A rash is a change in the color of your skin. A rash can also change the way your skin feels. There are many different conditions and factors that can cause a rash. Some rashes may disappear after a few days, but some may last for a few weeks. Common causes of rashes include: Viral infections, such as: Colds. Measles. Hand, foot, and mouth disease. Bacterial infections, such as: Scarlet fever. Impetigo. Fungal infections, such as Candida. Allergic reactions to food, medicines, or skin care products. Follow these instructions at home: The goal of treatment is to stop the itching and keep the rash from spreading. Pay attention to any changes in your symptoms. Follow these instructions tohelp with your condition: Medicine Take or apply over-the-counter and prescription medicines only as told by your health  care provider. These may include: Corticosteroid creams to treat red or swollen skin. Anti-itch lotions. Oral allergy medicines (antihistamines). Oral corticosteroids for severe symptoms.  Skin care Apply cool compresses to the affected areas. Do not scratch or rub your skin. Avoid covering the rash. Make sure the rash is exposed to air as much as possible. Managing itching and discomfort Avoid hot showers or baths, which can make itching worse. A  cold shower may help. Try taking a bath with: Epsom salts. Follow manufacturer instructions on the packaging. You can get these at your local pharmacy or grocery store. Baking soda. Pour a small amount into the bath as told by your health care provider. Colloidal oatmeal. Follow manufacturer instructions on the packaging. You can get this at your local pharmacy or grocery store. Try applying baking soda paste to your skin. Stir water into baking soda until it reaches a paste-like consistency. Try applying calamine lotion. This is an over-the-counter lotion that helps to relieve itchiness. Keep cool and out of the sun. Sweating and being hot can make itching worse. General instructions  Rest as needed. Drink enough fluid to keep your urine pale yellow. Wear loose-fitting clothing. Avoid scented soaps, detergents, and perfumes. Use gentle soaps, detergents, perfumes, and other cosmetic products. Avoid any substance that causes your rash. Keep a journal to help track what causes your rash. Write down: What you eat. What cosmetic products you use. What you drink. What you wear. This includes jewelry. Keep all follow-up visits as told by your health care provider. This is important.  Contact a health care provider if: You sweat at night. You lose weight. You urinate more than normal. You urinate less than normal, or you notice that your urine is a darker color than usual. You feel weak. You vomit. Your skin or the whites of your eyes look yellow (jaundice). Your skin: Tingles. Is numb. Your rash: Does not go away after several days. Gets worse. You are: Unusually thirsty. More tired than normal. You have: New symptoms. Pain in your abdomen. A fever. Diarrhea. Get help right away if you: Have a fever and your symptoms suddenly get worse. Develop confusion. Have a severe headache or a stiff neck. Have severe joint pains or stiffness. Have a seizure. Develop a rash that  covers all or most of your body. The rash may or may not be painful. Develop blisters that: Are on top of the rash. Grow larger or grow together. Are painful. Are inside your nose or mouth. Develop a rash that: Looks like purple pinprick-sized spots all over your body. Has a "bull's eye" or looks like a target. Is not related to sun exposure, is red and painful, and causes your skin to peel. Summary A rash is a change in the color of your skin. Some rashes disappear after a few days, but some may last for a few weeks. The goal of treatment is to stop the itching and keep the rash from spreading. Take or apply over-the-counter and prescription medicines only as told by your health care provider. Contact a health care provider if you have new or worsening symptoms. Keep all follow-up visits as told by your health care provider. This is important. This information is not intended to replace advice given to you by your health care provider. Make sure you discuss any questions you have with your healthcare provider. Document Revised: 04/27/2018 Document Reviewed: 08/07/2017 Elsevier Patient Education  2022 [employer] Pruritus Pruritus is an itchy feeling on the  skin. One of the most common causes is dry skin, but many different things can cause itching. Most cases of itching do notrequire medical attention. Sometimes itchy skin can turn into a rash. Follow these instructions at home: Skin care  Apply moisturizing lotion to your skin as needed. Lotion that contains petroleum jelly is best. Take medicines or apply medicated creams only as told by your health care provider. This may include: Corticosteroid cream. Anti-itch lotions. Oral antihistamines. Apply a cool, wet cloth (cool compress) to the affected areas. Take baths with one of the following: Epsom salts. You can get these at your local pharmacy or grocery store. Follow the instructions on the packaging. Baking soda. Pour a small  amount into the bath as told by your health care provider. Colloidal oatmeal. You can get this at your local pharmacy or grocery store. Follow the instructions on the packaging. Apply baking soda paste to your skin. To make the paste, stir water into a small amount of baking soda until it reaches a paste-like consistency. Do not scratch your skin. Do not take hot showers or baths, which can make itching worse. A cool shower may help with itching as long as you apply moisturizing lotion after the shower. Do not use scented soaps, detergents, perfumes, and cosmetic products. Instead, use gentle, unscented versions of these items.  General instructions Avoid wearing tight clothes. Keep a journal to help find out what is causing your itching. Write down: What you eat and drink. What cosmetic products you use. What soaps or detergents you use. What you wear, including jewelry. Use a humidifier. This keeps the air moist, which helps to prevent dry skin. Be aware of any changes in your itchiness. Contact a health care provider if: The itching does not go away after several days. You are unusually thirsty or urinating more than normal. Your skin tingles or feels numb. Your skin or the white parts of your eyes turn yellow (jaundice). You feel weak. You have any of the following: Night sweats. Tiredness (fatigue). Weight loss. Abdominal pain. Summary Pruritus is an itchy feeling on the skin. One of the most common causes is dry skin, but many different conditions and factors can cause itching. Apply moisturizing lotion to your skin as needed. Lotion that contains petroleum jelly is best. Take medicines or apply medicated creams only as told by your health care provider. Do not take hot showers or baths. Do not use scented soaps, detergents, perfumes, or cosmetic products. This information is not intended to replace advice given to you by your health care provider. Make sure you discuss any  questions you have with your healthcare provider. Document Revised: 01/17/2017 Document Reviewed: 01/17/2017 Else[employer] Patient Education  2022 [employer] Thoracic Strain Rehab Ask your health care provider which exercises are safe for you. Do exercises exactly as told by your health care provider and adjust them as directed. It is normal to feel mild stretching, pulling, tightness, or discomfort as you do these exercises. Stop right away if you feel sudden pain or your pain gets worse. Do not begin these exercises until told by your health care provider. Stretching and range-of-motion exercise This exercise warms up your muscles and joints and improves the movement and flexibility of your back and shoulders. This exercise also helps to relievepain. Chest and spine stretch  Lie down on your back on a firm surface. Roll a towel or a small blanket so it is about 4 inches (10 cm) in diameter. Put the  towel lengthwise under the middle of your back so it is under your spine, but not under your shoulder blades. Put your hands behind your head and let your elbows fall to your sides. This will increase your stretch. Take a deep breath (inhale). Hold for ____5______ seconds. Relax after you breathe out (exhale). Repeat ______3____ times. Complete this exercise ___2_______ times a day. Strengthening exercises These exercises build strength and endurance in your back and your shoulder blade muscles. Endurance is the ability to use your muscles for a long time,even after they get tired. Alternating arm and leg raises  Get on your hands and knees on a firm surface. If you are on a hard floor, you may want to use padding, such as an exercise mat, to cushion your knees. Line up your arms and legs. Your hands should be directly below your shoulders, and your knees should be directly below your hips. Lift your left leg behind you. At the same time, raise your right arm and straighten it in front of you. Do  not lift your leg higher than your hip. Do not lift your arm higher than your shoulder. Keep your abdominal and back muscles tight. Keep your hips facing the ground. Do not arch your back. Keep your balance carefully, and do not hold your breath. Hold for ___5_______ seconds. Slowly return to the starting position and repeat with your right leg and your left arm. Repeat ___2_______ times. Complete this exercise ___2_______ times a day. Straight arm rows This exercise is also called shoulder extension exercise. Stand with your feet shoulder width apart. Secure an exercise band to a stable object in front of you so the band is at or above shoulder height. Hold one end of the exercise band in each hand. Straighten your elbows and lift your hands up to shoulder height. Step back, away from the secured end of the exercise band, until the band stretches. Squeeze your shoulder blades together and pull your hands down to the sides of your thighs. Stop when your hands are straight down by your sides. This is shoulder extension. Do not let your hands go behind your body. Hold for _____5_____ seconds. Slowly return to the starting position. Repeat _____3_____ times. Complete this exercise _____2_____ times a day. Prone shoulder external rotation Lie on your abdomen on a firm bed so your left / right forearm hangs over the edge of the bed and your upper arm is on the bed, straight out from your body. This is the prone position. Your elbow should be bent. Your palm should be facing your feet. If instructed, hold a __none________ weight in your hand. Squeeze your shoulder blade toward the middle of your back. Do not let your shoulder lift toward your ear. Keep your elbow bent in a 90-degree angle (right angle) while you slowly move your forearm up toward the ceiling. Move your forearm up to the height of the bed, toward your head. This is external rotation. Your upper arm should not move. At the top of  the movement, your palm should face the floor. Hold for _____5_____ seconds. Slowly return to the starting position and relax your muscles. Repeat ____2______ times. Complete this exercise ___2_______ times a day. Rowing scapular retraction This is an exercise in which the shoulder blades (scapulae) are pulled toward each other (retraction). Sit in a stable chair without armrests, or stand up. Secure an exercise band to a stable object in front of you so the band is at shoulder height.  Hold one end of the exercise band in each hand. Your palms should face down. Bring your arms out straight in front of you. Step back, away from the secured end of the exercise band, until the band stretches. Pull the band backward. As you do this, bend your elbows and squeeze your shoulder blades together, but avoid letting the rest of your body move. Do not shrug your shoulders upward while you do this. Stop when your elbows are at your sides or slightly behind your body. Hold for ____5______ seconds. Slowly straighten your arms to return to the starting position. Repeat ___2_______ times. Complete this exercise _____2_____ times a day. Posture and body mechanics Good posture and healthy body mechanics can help to relieve stress in your body's tissues and joints. Body mechanics refers to the movements and positions of your body while you do your daily activities. Posture is part of body mechanics. Good posture means: Your spine is in its natural S-curve position (neutral). Your shoulders are pulled back slightly. Your head is not tipped forward. Follow these guidelines to improve your posture and body mechanics in youreveryday activities. Standing  When standing, keep your spine neutral and your feet about hip width apart. Keep a slight bend in your knees. Your ears, shoulders, and hips should line up with each other. When you do a task in which you lean forward while standing in one place for a long time,  place one foot up on a stable object that is 2-4 inches (5-10 cm) high, such as a footstool. This helps keep your spine neutral.  Sitting  When sitting, keep your spine neutral and keep your feet flat on the floor. Use a footrest, if necessary, and keep your thighs parallel to the floor. Avoid rounding your shoulders, and avoid tilting your head forward. When working at a desk or a computer, keep your desk at a height where your hands are slightly lower than your elbows. Slide your chair under your desk so you are close enough to maintain good posture. When working at a computer, place your monitor at a height where you are looking straight ahead and you do not have to tilt your head forward or downward to look at the screen.  Resting When lying down and resting, avoid positions that are most painful for you. If you have pain with activities such as sitting, bending, stooping, or squatting (flexion-basedactivities), lie in a position in which your body does not bend very much. For example, avoid curling up on your side with your arms and knees near your chest (fetal position). If you have pain with activities such as standing for a long time or reaching with your arms (extension-basedactivities), lie with your spine in a neutral position and bend your knees slightly. Try the following positions: Lie on your side with a pillow between your knees. Lie on your back with a pillow under your knees.  Lifting  When lifting objects, keep your feet at least shoulder width apart and tighten your abdominal muscles. Bend your knees and hips and keep your spine neutral. It is important to lift using the strength of your legs, not your back. Do not lock your knees straight out. Always ask for help to lift heavy or awkward objects.  This information is not intended to replace advice given to you by your health care provider. Make sure you discuss any questions you have with your healthcare provider. Document  Revised: 04/27/2018 Document Reviewed: 02/12/2018 Else[employer] Patient Education  2022  [employer] Thoracic Strain A thoracic strain, which is sometimes called a mid-back strain, is an injury to the muscles or tendons that attach to the upper part of your back behind yourchest. This type of injury occurs when a muscle is overstretched or overloaded. Thoracic strains can range from mild to severe. Mild strains may involve stretching a muscle or tendon without tearing it. These injuries may heal in 1-2 weeks. More severe strains involve tearing of muscle fibers or tendons.These will cause more pain and may take 6-8 weeks to heal. What are the causes? This condition may be caused by: Trauma, such as a fall or a hit to the body. Twisting or overstretching the back. This may result from doing activities that require a lot of energy, such as lifting heavy objects. In some cases, the cause may not be known. What increases the risk? This injury is more common in: Athletes. People with obesity. What are the signs or symptoms? The main symptom of this condition is pain in the middle back, especially with movement. Other symptoms include: Stiffness or limited range of motion. Sudden muscle tightening (spasms). How is this diagnosed? This condition may be diagnosed based on: Your symptoms. Your medical history. A physical exam. Imaging tests, such as X-rays or an MRI. How is this treated? This condition may be treated with: Resting the injured area. Applying heat and cold to the injured area. Over-the-counter medicines for pain and inflammation, such as NSAIDs. Prescription pain medicine or muscle relaxants may be needed for a short time. Physical therapy. This will involve doing stretching and strengthening exercises. Follow these instructions at home: Managing pain, stiffness, and swelling     If directed, put ice on the injured area. Put ice in a plastic bag. Place a towel between your  skin and the bag. Leave the ice on for 20 minutes, 2-3 times a day. If directed, apply heat to the affected area as often as told by your health care provider. Use the heat source that your health care provider recommends, such as a moist heat pack or a heating pad. Place a towel between your skin and the heat source. Leave the heat on for 20-30 minutes. Remove the heat if your skin turns bright red. This is especially important if you are unable to feel pain, heat, or cold. You may have a greater risk of getting burned. Activity Rest and return to your normal activities as told by your health care provider. Ask your health care provider what activities are safe for you. Do exercises as told by your health care provider. Medicines Take over-the-counter and prescription medicines only as told by your health care provider. Ask your health care provider if the medicine prescribed to you: Requires you to avoid driving or using heavy machinery. Can cause constipation. You may need to take these actions to prevent or treat constipation: Drink enough fluid to keep your urine pale yellow. Take over-the-counter or prescription medicines. Eat foods that are high in fiber, such as beans, whole grains, and fresh fruits and vegetables. Limit foods that are high in fat and processed sugars, such as fried or sweet foods. Injury prevention To prevent a future mid-back injury: Always warm up properly before physical activity or sports. Cool down and stretch after being active. Use correct form when playing sports and lifting heavy objects. Bend your knees before you lift heavy objects. Use good posture when sitting and standing. Stay physically fit and maintain a healthy weight. Do at least  150 minutes of moderate-intensity exercise each week, such as brisk walking or water aerobics. Do strength exercises at least 2 times each week.  General instructions Do not use any products that contain nicotine or  tobacco, such as cigarettes, e-cigarettes, and chewing tobacco. If you need help quitting, ask your health care provider. Keep all follow-up visits as told by your health care provider. This is important. Contact a health care provider if: Your pain is not helped by medicine. Your pain or stiffness is getting worse. You develop pain or stiffness in your neck or lower back. Get help right away if you: Have shortness of breath. Have chest pain. Develop numbness or weakness in your legs or arms. Have involuntary loss of urine (urinary incontinence). Summary A thoracic strain, which is sometimes called a mid-back strain, is an injury to the muscles or tendons that attach to the upper part of your back behind your chest. This type of injury occurs when a muscle is overstretched or overloaded. Rest and return to your normal activities as told by your health care provider. If directed, apply heat or ice to the affected area as often as told by your health care provider. Take over-the-counter and prescription medicines only as told by your health care provider. Contact a health care provider if you have new or worsening symptoms. This information is not intended to replace advice given to you by your health care provider. Make sure you discuss any questions you have with your healthcare provider. Document Revised: 11/21/2017 Document Reviewed: 11/21/2017 Elsevier Patient Education  2022 [employer].

## 2020-07-28 NOTE — Progress Notes (Signed)
Subjective:    Patient ID: Gina Mckenzie, female    DOB: 1965-07-30, 55 y.o.   MRN: [id number]  55y/o Caucasian established female pt c/o rash with itching and some pain to L mid back just under bra line and L anterior chest/abdomen distal to bra line x1 week. Reports musculoskeletal pain improved with 800mg  ibuprofen but has run out and would like refill today.  Was taking 200mg  advil but didn't help; friend gave her an 800mg  tablet at work yesterday and it helped much more to resolve pain so it didn't interfere with working.  Denied bruising/swelling/weakness of extremities.  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  Rash not draining or weeping, denied blisters.  Just itchy has tried topical benadryl gel and it helps but doesn't resolve it.  Patient painted most of her house interior this weekend and she believes that is what is causing muscle aches body wide (she couldn't finish all her rooms).  Denied dyspnea/dysphagia/dysphasia/wheezing/shortness of breath/tongue swelling/nausea/vomiting/diarrhea or chest pain.      Review of Systems  Constitutional:  Positive for activity change. Negative for appetite change, chills, diaphoresis, fatigue and fever.  HENT:  Negative for congestion, facial swelling, trouble swallowing and voice change.   Eyes:  Negative for photophobia and visual disturbance.  Respiratory:  Negative for cough, choking, shortness of breath, wheezing and stridor.   Cardiovascular:  Negative for chest pain and palpitations.  Gastrointestinal:  Negative for abdominal pain, diarrhea, nausea and vomiting.  Endocrine: Negative for cold intolerance and heat intolerance.  Genitourinary:  Negative for difficulty urinating.  Musculoskeletal:  Positive for myalgias. Negative for arthralgias, back pain, gait problem, joint swelling, neck pain and neck stiffness.  Skin:  Positive for color change and rash. Negative for pallor and wound.  Allergic/Immunologic: Negative for  food allergies.  Neurological:  Negative for dizziness, tremors, syncope, facial asymmetry, speech difficulty, weakness, light-headedness, numbness and headaches.  Hematological:  Negative for adenopathy. Does not bruise/bleed easily.  Psychiatric/Behavioral:  Negative for agitation, confusion and sleep disturbance.       Objective:   Physical Exam Vitals and nursing note reviewed.  Constitutional:      General: She is awake. She is not in acute distress.    Appearance: Normal appearance. She is well-developed, well-groomed and normal weight. She is not ill-appearing, toxic-appearing or diaphoretic.  HENT:     Head: Normocephalic and atraumatic.     Jaw: There is normal jaw occlusion.     Salivary Glands: Right salivary gland is not diffusely enlarged. Left salivary gland is not diffusely enlarged.     Right Ear: Hearing and external ear normal.     Left Ear: Hearing and external ear normal.     Nose: Nose normal. No congestion or rhinorrhea.     Mouth/Throat:     Lips: Pink. No lesions.     Mouth: Mucous membranes are moist.     Pharynx: Oropharynx is clear.  Eyes:     General: Lids are normal. Vision grossly intact. Gaze aligned appropriately. No visual field deficit or scleral icterus.       Right eye: No discharge.        Left eye: No discharge.     Extraocular Movements: Extraocular movements intact.     Conjunctiva/sclera: Conjunctivae normal.     Pupils: Pupils are equal, round, and reactive to light.  Neck:     Trachea: Trachea and phonation normal. No tracheal deviation.  Cardiovascular:     Rate and Rhythm:  Normal rate and regular rhythm.     Pulses: Normal pulses.          Radial pulses are 2+ on the right side and 2+ on the left side.  Pulmonary:     Effort: Pulmonary effort is normal. No respiratory distress.     Breath sounds: Normal breath sounds and air entry. No stridor or transmitted upper airway sounds. No wheezing.     Comments: Spoke full sentences without  difficulty; no cough observed in exam room Abdominal:     General: Abdomen is flat.     Palpations: Abdomen is soft.  Musculoskeletal:        General: No swelling, deformity or signs of injury. Normal range of motion.     Right shoulder: No swelling, deformity, effusion, laceration or crepitus. Normal range of motion. Normal strength.     Left shoulder: No swelling, deformity, effusion, laceration or crepitus. Normal range of motion. Normal strength.     Right elbow: No swelling, deformity, effusion or lacerations. Normal range of motion.     Left elbow: No swelling, deformity, effusion or lacerations. Normal range of motion.     Right hand: No swelling, deformity or lacerations. Normal range of motion. Normal strength.     Left hand: No swelling, deformity or lacerations. Normal range of motion. Normal strength.     Cervical back: Normal range of motion and neck supple. No swelling, edema, deformity, erythema, signs of trauma, lacerations, rigidity or crepitus. Normal range of motion.     Thoracic back: No swelling, edema, deformity, signs of trauma or lacerations. Normal range of motion.     Lumbar back: No swelling, edema, deformity, signs of trauma or lacerations. Normal range of motion.     Right lower leg: No lacerations. No edema.     Left lower leg: No lacerations. No edema.     Right ankle: No swelling, deformity, ecchymosis or lacerations. Normal range of motion.     Left ankle: No swelling, deformity, ecchymosis or lacerations. Normal range of motion.  Lymphadenopathy:     Head:     Right side of head: No submandibular or preauricular adenopathy.     Left side of head: No submandibular or preauricular adenopathy.     Cervical:     Right cervical: No superficial cervical adenopathy.    Left cervical: No superficial cervical adenopathy.  Skin:    General: Skin is warm and dry.     Capillary Refill: Capillary refill takes less than 2 seconds.     Coloration: Skin is not ashen,  cyanotic, jaundiced, mottled, pale or sallow.     Findings: Erythema and rash present. No abrasion, abscess, acne, bruising, burn, ecchymosis, signs of injury, laceration, lesion, petechiae or wound. Rash is macular and papular. Rash is not crusting, nodular, purpuric, pustular, scaling, urticarial or vesicular.     Nails: There is no clubbing.       Neurological:     General: No focal deficit present.     Mental Status: She is alert and oriented to person, place, and time. Mental status is at baseline.     GCS: GCS eye subscore is 4. GCS verbal subscore is 5. GCS motor subscore is 6.     Cranial Nerves: Cranial nerves are intact. No cranial nerve deficit, dysarthria or facial asymmetry.     Sensory: Sensation is intact.     Motor: Motor function is intact. No weakness, tremor, atrophy, abnormal muscle tone or seizure activity.  Coordination: Coordination is intact. Coordination normal.     Gait: Gait is intact. Gait normal.     Comments: In/out of chair without difficulty; gait sure and steady in clinic; bilateral hand grasp equal 5/5  Psychiatric:        Attention and Perception: Attention and perception normal.        Mood and Affect: Mood and affect normal.        Speech: Speech normal.        Behavior: Behavior normal. Behavior is cooperative.        Thought Content: Thought content normal.        Cognition and Memory: Cognition and memory normal.        Judgment: Judgment normal.    Applied hydrocortisone 1% to lesions in clinic x3 locations.  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  Reiterated start zyrtec 10mg  po BID when she gets home today as no zyrtec in clinic stock.  Continue prn application aquaphor/hydrocortisone/calagel and may alternate along with cryotherapy 15 minutes.  Patient verbalized understanding information/instructions, agreed with plan of care and had no further questions at this time.      Assessment &  Plan:  A-pruritis dermatitis and thoracic myofascial strain initial encounter  P-Start zyrtec 10mg  po BID prn itching at home.  calagel thin smear BID prn itching given 4 UD from clinic stock; do not get in eyes; if worsening with calagel use stop and trial hydrocortisone 1% topical BID prn itching/rash given 4 UD from clinic stock.  Wash hands before and after application.  Avoid hot steam showers.  Apply emollient twice a day e.g. Fragrance free vaseline.  May apply ice/cold compress 5 minutes QID prn itching/swelling. Discussed cleaning and painting this past week chemicals/allergens/irirtants that transferred from items to hands then her body/clothing.  Tepid Shower when she finishes work/prior to bedtime.  Avoid harsh/abrasive soaps use fragrance free/sensitive like dove/cetaphil.  Apply emollient at least daily or if dry/flaky skin noted given 4 UD aquaphor from clinic stock.  Medication as directed. Call or return to clinic as needed if these symptoms worsen or fail to improve as anticipated. Exitcare handout on contact dermatitis and pruritis and shingles printed and given to patient.  Shingles differential diagnosis discussed with patient if any blisters or spreading in linear pattern to notify me and will call in antiviral for her.  Follow up for re-evaluation in 48 hours if no improvement and/or worsening of rash with plan of care. Patient verbalized agreement and understanding of treatment plan and had no further questions at this time   Ibuprofen 800mg  po TID prn pain #30 RF0 dispensed from [facility].  Home stretches demonstrated to patient-e.g. Arm circles, walking up wall, chest stretches, neck AROM, chin tucks, knee to chest and rock side to side on back. Self massage or professional prn, foam roller use or tennis/racquetball.  Heat/cryotherapy 15 minutes QID prn.  Biofreeze gel apply QID prn pain. Consider physical therapy referral if no improvement with prescribed therapy from [facility] and/or  chiropractic care.  Ensure ergonomics correct desk at work avoid repetitive motions if possible/holding phone/laptop in hand use desk/stand and/or break up lifting items into smaller loads/weights.  Patient was instructed to rest, ice, and ROM exercises.  Activity as tolerated.   Follow up if symptoms persist or worsen especially if loss of bowel/bladder control, arm/leg weakness and/or saddle paresthesias.  Exitcare handout on thoracic strain and rehab exercises printed and given to  patient.  Patient verbalized agreement and understanding of treatment plan and had no further questions at this time.  P2:  Injury Prevention and Fitness.

## 2020-10-09 ENCOUNTER — Encounter: Payer: Self-pay | Admitting: Registered Nurse

## 2020-10-09 ENCOUNTER — Telehealth: Payer: Self-pay | Admitting: Registered Nurse

## 2020-10-09 MED ORDER — SALINE SPRAY 0.65 % NA SOLN: 2.0000 | NASAL | 0 refills | Status: AC

## 2020-10-09 MED ORDER — CETIRIZINE HCL 10 MG PO TABS: 10.0000 mg | ORAL_TABLET | Freq: Every day | ORAL | 0 refills | Status: AC | PRN

## 2020-10-09 NOTE — Telephone Encounter (Signed)
[employer] Tonya notified NP that pt reported symptoms/called in sick 10/09/20.  Patient contacted via telephone fatigue, cough, sore throat and congestion nasal.  Has been sleeping in this am hasn't tried any medicine.  Has tylenol at home.  Hasn't previously taken covid test prefers home test to [facility] appt.   Denied known sick contacts.   Pt began quarantine today. Patient did not develop symptoms of  trouble breathing, chest pain, nausea, vomiting, diarrhea, HA, body aches, fever or chills.   5 day quarantine per [facility] recommendations. Day 1 of quarantine was 09 Oct 2020. Plan for testing today Day 1 and if negative will repeat on Day 3 10/11/2020 home test.  Patient to contact me if vomiting after coughing or unable to tolerate po fluids.  Discussed allergies fall flaring for many and other viral illnesses circulating in community. Reviewed possible Covid symptoms including cough, shortness of breath with exertion or at rest, runny nose, congestion, sinus pain/pressure, sore throat, fever/chills, body aches, fatigue, loss of taste/smell, GI symptoms of nausea/vomiting/diarrhea. Go to ER if dizziness/syncope, confusion, blue tint to lips/face, severe shortness of breath/difficulty breathing/wheezing.     Patient to isolate in own room and if possible use only one bathroom if living with others in home.  Wear mask when out of room to help prevent spread to others in household.  Sanitize high touch surfaces with lysol/chlorox/bleach spray or wipes daily as viruses are known to live on surfaces from 24 hours to days.  Patient has OTC medications at home for cough/cold/allergies. May use flonase nasal 1 spray each nostril BID prn rhinitis.  Dayquil and nyquil per manufacturer instructions.   Discussed honey 1 tablespoon every 4 hours is a natural cough suppressant/throat soother as is salt water gargles or eating soup broth salty. Avoid dehydration and drink water to keep urine pale yellow clear  and voiding every 2-4 hours while awake.  Patient alert and oriented x3, spoke full sentences without difficulty.  Some nasal congestion noted.  Intermittent dry  cough.  No throat clearing audible during 4 minute telephone call.  Discussed with patient she can contact me at this time number [phone] or [email or web address].  Reviewed [employer] covid test insurance benefits with patient and sent email with instructions for purchase/reimbursement [email or web address]   Pt verbalized understanding and agreement with plan of care. No further questions/concerns at this time. Pt reminded to contact clinic with any changes in symptoms or questions/concerns. HR notified patient to quarantine and re-evaluation will occur this weekend.  Testing today and repeat Day 3 if today negative.

## 2020-10-12 NOTE — Telephone Encounter (Signed)
Attempted to call pt for sx check in and see if she RTW today 9/26. Working remote and call will not complete with [employer]. Will attempt to contact thru work line tomorrow when back in clinic.

## 2020-10-13 NOTE — Telephone Encounter (Signed)
RN Rolly Salter discussed case with me via telephone this afternoon.  Patient reported did not perform covid test, returned to work as expected, feeling well "it was just allergies" symptoms resolved with allergy medication.  Discussed with RN Rolly Salter due to past experience covid symptoms will not resolve with oral antihistamines alone.  Low risk patient has covid most likely seasonal allergic rhinitis flare.  RN Rolly Salter stated she asked patient to test covid home and patient refused.  If new or worsening symptoms will have patient covid test since community positive test rate still around 15%.  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  I spoke with patient via telephone 10/11/20 no congestion/cough or laryngitis/voice cracking during telephone call and patient was cleared to return to work onsite.  HR notified.

## 2021-01-21 ENCOUNTER — Encounter: Payer: Self-pay | Admitting: *Deleted

## 2021-01-21 ENCOUNTER — Telehealth: Payer: Self-pay | Admitting: *Deleted

## 2021-01-21 DIAGNOSIS — R197 Diarrhea, unspecified: Secondary | ICD-10-CM

## 2021-01-21 NOTE — Telephone Encounter (Signed)
Pt returned call. Reports diarrhea since last night. Some HA this morning. 5-6 episodes since last night. No appetite, no food tried. Tolerating po fluids. Denies n/v. Has been using Imodium overnight. Last episode diarrhea around 1100 today. Will remain out of work tomorrow as well as must be 24hrs without sx without Imodium before returning and shift starts at 0730. Will f/u tomorrow for sx check. Pt agreeable and denies questions or concerns.

## 2021-01-21 NOTE — Telephone Encounter (Signed)
Pt called out 1/5 reporting stomach issues. Called pt to discuss. No answer. LVM.

## 2021-01-21 NOTE — Telephone Encounter (Signed)
Reviewed RN Haley note agreed with plan of care. 

## 2021-01-22 NOTE — Telephone Encounter (Signed)
Multiple attempts made to call pt. Mesg received each time that call cannot be completed at this time. No issues calling out to other patients on remote work number.

## 2021-01-22 NOTE — Telephone Encounter (Signed)
Patient contacted by telephone this evening and stated headache this am drank coffee and it stopped.  Patient denied diarrhea/fever/sneezing/coughing Patient took advil and imodium yesterday but none today.  Tolerating po intake but low appetite still.  Patient A&Ox3 spoke full sentences without difficulty; no cough/congestion/throat clearing audible during 9 minute telephone call.  Discussed with patient will follow up with her again on Sunday 8 Jan for re-evaluation prior to returning to work 9 Jan.  Patient reported feeling better than yesterday this evening.  Encouraged hydration with clear soup broths/liquids today.  Patient verbalized understanding information/instructions, agreed with plan of care and had no further questions at this time.

## 2021-01-22 NOTE — Telephone Encounter (Signed)
Attempted to reach patient via telephone "message states your call cannot be completed at this time."  Will try again this weekend to reach patient.

## 2021-01-24 NOTE — Telephone Encounter (Signed)
Patient returned call stated no headache or GI upset today but having a little bit runny nose.  If new runny nose continues overnight will stay home tomorrow and call out sick.  Discussed RN Hildred Alamin will be calling to follow up with her tomorrow.  Discussed if any other new symptoms overnight or in am stay home and RN Hildred Alamin will contact her.  Patient A&Ox3 spoke full sentences without difficulty; no audible congestion/throat clearing or cough noted during 2 minute telephone call.  HR notified patient cleared to return onsite tomorrow unless new or worsening symptoms will call out sick and await clinic staff follow up.  Patient verbalized understanding information/instructions, agreed with plan of care and had no further questions at this time.

## 2021-01-25 NOTE — Telephone Encounter (Signed)
Pt reports runny nose/congestion resolved overnight. No concerns this morning. Did RTW as planned. Denies needs or concerns. Closing encounter.

## 2021-01-25 NOTE — Telephone Encounter (Signed)
Reviewed RN Rolly Salter note all symptoms resolved and RTW as planned today

## 2022-08-10 ENCOUNTER — Telehealth: Payer: Self-pay | Admitting: Registered Nurse

## 2022-08-10 ENCOUNTER — Encounter: Payer: Self-pay | Admitting: Registered Nurse

## 2022-08-10 NOTE — Telephone Encounter (Signed)
Patient trying to set up teledoc account/app for Be Well 2025 appt.  Error message after typing in her information and refreshing states to call teledoc.  HR Tonya notified and checked patient does not have account active on her roster and will contact [employer] to find out why her account can not be made then contact patient with further instructions.  Message left for RN Bess Kinds also.  Patient preferred cell number [phone] and email gmail

## 2022-08-12 NOTE — Telephone Encounter (Signed)
Archie Patten notified NP she asked HR Alexis to assist patient with teledoc 08/12/22  RN Kimrey notified.

## 2022-08-15 NOTE — Telephone Encounter (Signed)
Noted patient was able to complete teledoc account required for Be Well insurance discount

## 2023-08-04 ENCOUNTER — Telehealth: Payer: Self-pay | Admitting: Registered Nurse

## 2023-08-04 ENCOUNTER — Encounter: Payer: Self-pay | Admitting: Registered Nurse

## 2023-08-04 DIAGNOSIS — Z0000 Encounter for general adult medical examination without abnormal findings: Secondary | ICD-10-CM

## 2023-08-04 NOTE — Telephone Encounter (Signed)
 Message left for patient VM reminder Be Well program ends 17 Aug 2023 for insurance discount starting 18 Oct 2023.  Reminder paperwork and labs fasting can be completed with [facility] or [facility] clinic staff.  RN Apolinar in clinic next Monday/Tuesday.  Please also notify us  if you will be using other health insurance.

## 2023-08-10 NOTE — Telephone Encounter (Signed)
 Patient signed tobacco attestation stated she is smoker current.  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  Discussed if she changes mind last day to complete is 17 Aug 2023.  [facility] form given to HR Jen refused appt and + tobacco use in previous 12 months.

## 2023-10-10 ENCOUNTER — Encounter: Payer: Self-pay | Admitting: Registered Nurse

## 2023-10-10 ENCOUNTER — Telehealth: Payer: Self-pay | Admitting: Registered Nurse

## 2023-10-10 DIAGNOSIS — A084 Viral intestinal infection, unspecified: Secondary | ICD-10-CM

## 2023-10-10 NOTE — Telephone Encounter (Signed)
 Symptoms started on Sunday diarrhea denied vomiting/fever/chills/blood in urine.  Has been hydrating and bland diet.  Feeling better today.  Called out sick yesterday and today supervisor notified clinic staff to contact patient.  I have recommended clear fluids and advanced to soft as tolerated.  Avoid dairy, spicy and fried foods until diarrhea resolves. Avoid dehydration drink noncaffeinated beverages (water, ginger ale, soup broth, popsicles, no sugar added gatorade/powerade) to urinate every 2-4 hours pale yellow urine.  It is easy to become dehydrated when having diarrhea along with electrolyte imbalances.    Patient given exitcare handouts on viral gastroenteritis and foods to relieve diarrhea. Medications as directed.  Return to the clinic if symptoms persist or worsen; I have alerted the patient to call if high fever, dehydration, marked weakness, fainting, increased abdominal pain, blood in stool or vomit. Patient given work excuse note for 48 hours.  HR and supervisor team notified 9/22-23/25  Patient to use normal call out procedures if new symptoms or diarrhea resumes prior to start of shift tomorrow.   Patient verbalized agreement and understanding of treatment plan and had no further questions at this time. P2: Hand washing and fitness

## 2023-10-12 NOTE — Telephone Encounter (Signed)
 Patient reported returned to work as expected yesterday forgot to call NP after work denied concerns.  A&Ox3 seen in workcenter today gait sure and steady respirations even and unlabored RA skin warm dry and pink  tolerating po intake without difficulty and denied concerns today also.

## 2023-12-18 ENCOUNTER — Ambulatory Visit: Payer: Self-pay | Admitting: General Practice

## 2023-12-18 VITALS — BP 105/68 | HR 70 | Resp 16

## 2023-12-18 DIAGNOSIS — M79641 Pain in right hand: Secondary | ICD-10-CM | POA: Insufficient documentation

## 2023-12-18 NOTE — Progress Notes (Signed)
 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 She reports having 10/10 pain initially. After treating herself with ice, the pain is now down to 6/10 and continuing to decrease. She reports having arthritis and is unsure if the slight swelling is from that or the finger injury.  VSS; Gauze taken off (already applied outside of the clinic). Danni has slight swelling on the dorsal side of the finger with a small abrasion. Anterior finger has a 2 mm black blister at the first joint of the finger. Very minimal swelling, she is able to make a fist, wiggle fingers and spread them apart. RN requested she remove the ring on this finger and watched her place in her pocket. Site cleaned with first aid antiseptic spray and band-aid applied. Provided extra band-aids to keep it clean after handwashing and work. Advised to not burst the small blister to prevent inviting germs or dirt into the area. Encouraged to add another ice pack if needed.  She states she can return to work without any issues. Employee returned to work with no further needs.

## 2023-12-20 ENCOUNTER — Encounter: Payer: Self-pay | Admitting: Registered Nurse

## 2023-12-20 ENCOUNTER — Telehealth: Payer: Self-pay | Admitting: Registered Nurse

## 2023-12-20 DIAGNOSIS — S6710XA Crushing injury of unspecified finger(s), initial encounter: Secondary | ICD-10-CM

## 2023-12-20 DIAGNOSIS — S6000XA Contusion of unspecified finger without damage to nail, initial encounter: Secondary | ICD-10-CM

## 2023-12-20 DIAGNOSIS — S60429A Blister (nonthermal) of unspecified finger, initial encounter: Secondary | ICD-10-CM

## 2023-12-20 NOTE — Telephone Encounter (Signed)
 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  Skin warm dry and pink; diameter blister 0.5cm removed bandaid to evaluate clean and dry full AROM and strength.  Patient stated working without difficulty today.  Continue plan of care as previously discussed with RN Karene keep covered at work do not pop blister and monitor for signs of infection e.g. worsening pain, red streaks, discharge or swelling.  No swelling noted today.  Patient agreed with plan of care and had no further questions at this time.  HR and safety officer notified patient had re-evaluation with NP today no further needs identified at this time.

## 2023-12-27 ENCOUNTER — Other Ambulatory Visit: Payer: Self-pay

## 2023-12-27 ENCOUNTER — Emergency Department (HOSPITAL_BASED_OUTPATIENT_CLINIC_OR_DEPARTMENT_OTHER)

## 2023-12-27 ENCOUNTER — Encounter (HOSPITAL_BASED_OUTPATIENT_CLINIC_OR_DEPARTMENT_OTHER): Payer: Self-pay | Admitting: Emergency Medicine

## 2023-12-27 ENCOUNTER — Emergency Department (HOSPITAL_BASED_OUTPATIENT_CLINIC_OR_DEPARTMENT_OTHER)
Admission: EM | Admit: 2023-12-27 | Discharge: 2023-12-27 | Disposition: A | Discharge status: Home / Self Care | Attending: Emergency Medicine | Admitting: Emergency Medicine

## 2023-12-27 DIAGNOSIS — R590 Localized enlarged lymph nodes: Secondary | ICD-10-CM | POA: Diagnosis not present

## 2023-12-27 DIAGNOSIS — X501XXA Overexertion from prolonged static or awkward postures, initial encounter: Secondary | ICD-10-CM | POA: Diagnosis not present

## 2023-12-27 DIAGNOSIS — R1031 Right lower quadrant pain: Secondary | ICD-10-CM | POA: Diagnosis present

## 2023-12-27 DIAGNOSIS — E871 Hypo-osmolality and hyponatremia: Secondary | ICD-10-CM | POA: Insufficient documentation

## 2023-12-27 LAB — CBC WITH DIFFERENTIAL/PLATELET
Abs Immature Granulocytes: 0.02 K/uL (ref 0.00–0.07)
Basophils Absolute: 0 K/uL (ref 0.0–0.1)
Basophils Relative: 1 %
Eosinophils Absolute: 0 K/uL (ref 0.0–0.5)
Eosinophils Relative: 1 %
HCT: 37 % (ref 36.0–46.0)
Hemoglobin: 12.9 g/dL (ref 12.0–15.0)
Immature Granulocytes: 0 %
Lymphocytes Relative: 19 %
Lymphs Abs: 1.4 K/uL (ref 0.7–4.0)
MCH: 30.9 pg (ref 26.0–34.0)
MCHC: 34.9 g/dL (ref 30.0–36.0)
MCV: 88.7 fL (ref 80.0–100.0)
Monocytes Absolute: 0.7 K/uL (ref 0.1–1.0)
Monocytes Relative: 10 %
Neutro Abs: 5.1 K/uL (ref 1.7–7.7)
Neutrophils Relative %: 69 %
Platelets: 258 K/uL (ref 150–400)
RBC: 4.17 MIL/uL (ref 3.87–5.11)
RDW: 12.3 % (ref 11.5–15.5)
WBC: 7.3 K/uL (ref 4.0–10.5)
nRBC: 0 % (ref 0.0–0.2)

## 2023-12-27 LAB — COMPREHENSIVE METABOLIC PANEL WITH GFR
ALT: 11 U/L (ref 0–44)
AST: 21 U/L (ref 15–41)
Albumin: 4.3 g/dL (ref 3.5–5.0)
Alkaline Phosphatase: 62 U/L (ref 38–126)
Anion gap: 12 (ref 5–15)
BUN: <5 mg/dL — ABNORMAL LOW (ref 6–20)
CO2: 23 mmol/L (ref 22–32)
Calcium: 8.8 mg/dL — ABNORMAL LOW (ref 8.9–10.3)
Chloride: 98 mmol/L (ref 98–111)
Creatinine, Ser: 0.58 mg/dL (ref 0.44–1.00)
GFR, Estimated: >60 mL/min (ref >60)
Glucose, Bld: 109 mg/dL — ABNORMAL HIGH (ref 70–99)
Potassium: 3.7 mmol/L (ref 3.5–5.1)
Sodium: 133 mmol/L — ABNORMAL LOW (ref 135–145)
Total Bilirubin: 0.3 mg/dL (ref 0.0–1.2)
Total Protein: 7.7 g/dL (ref 6.5–8.1)

## 2023-12-27 LAB — URINALYSIS, ROUTINE W REFLEX MICROSCOPIC
Bilirubin Urine: NEGATIVE
Glucose, UA: NEGATIVE mg/dL
Ketones, ur: NEGATIVE mg/dL
Leukocytes,Ua: NEGATIVE
Nitrite: NEGATIVE
Protein, ur: 30 mg/dL — AB
Specific Gravity, Urine: 1.015 (ref 1.005–1.030)
pH: 7 (ref 5.0–8.0)

## 2023-12-27 LAB — URINALYSIS, MICROSCOPIC (REFLEX)

## 2023-12-27 LAB — LIPASE, BLOOD: Lipase: 39 U/L (ref 11–51)

## 2023-12-27 MED ORDER — IOHEXOL 300 MG/ML  SOLN
75.0000 mL | Freq: Once | INTRAMUSCULAR | Status: AC | PRN
  Administered 2023-12-27: 75 mL via INTRAVENOUS

## 2023-12-27 NOTE — ED Provider Notes (Signed)
  EMERGENCY DEPARTMENT AT [facility] Provider Note   CSN: [id number] Arrival date & time: 12/27/23  1119     Patient presents with: Abdominal Pain   Gina Mckenzie is a 58 y.o. female patient with noncontributory past medical history reports to emergency room with complaint of 5 days of right lower quadrant abdominal pain that seems to be radiating toward her back.  She thinks pain is getting worse.  She has noted some swelling to this area as well which started after a lot of heavy lifting.  This swelling gets worse when she is standing.  She notes that she is having decreased appetite but she is tolerating eating and drinking without any vomiting or change in bowel movement.  She has not had any abdominal surgery before.  Denies any vaginal bleeding, vaginal discharge.  No urinary frequency or dysuria.  She is sexually active with 1 person but is not concerned about STD.    Abdominal Pain      Prior to Admission medications   Medication Sig Start Date End Date Taking? Authorizing Provider  cetirizine  (ZYRTEC ) 10 MG tablet Take 1 tablet (10 mg total) by mouth daily as needed for allergies. 10/09/20 11/08/20  Betancourt, Tina A, NP  sodium chloride (OCEAN) 0.65 % SOLN nasal spray Place 2 sprays into both nostrils every 2 (two) hours while awake. 10/09/20 11/08/20  Betancourt, Ellouise LABOR, NP    Allergies: Patient has no known allergies.    Review of Systems  Gastrointestinal:  Positive for abdominal pain.    Updated Vital Signs BP 98/63   Pulse 73   Temp 99.4 F (37.4 C) (Oral)   Resp 18   LMP 07/19/2011   SpO2 98%   Physical Exam Vitals and nursing note reviewed.  Constitutional:      General: She is not in acute distress.    Appearance: She is not toxic-appearing.  HENT:     Head: Normocephalic and atraumatic.  Eyes:     General: No scleral icterus.    Conjunctiva/sclera: Conjunctivae normal.  Cardiovascular:     Rate and Rhythm: Normal rate and  regular rhythm.     Pulses: Normal pulses.     Heart sounds: Normal heart sounds.  Pulmonary:     Effort: Pulmonary effort is normal. No respiratory distress.     Breath sounds: Normal breath sounds.  Abdominal:     General: Abdomen is flat. Bowel sounds are normal.     Palpations: Abdomen is soft.     Tenderness: There is abdominal tenderness.     Comments: RLQ TTP with right inguinal LAD no overlaying cellulitis/abscess   Skin:    General: Skin is warm and dry.     Findings: No lesion.  Neurological:     General: No focal deficit present.     Mental Status: She is alert and oriented to person, place, and time. Mental status is at baseline.     (all labs ordered are listed, but only abnormal results are displayed) Labs Reviewed  COMPREHENSIVE METABOLIC PANEL WITH GFR - Abnormal; Notable for the following components:      Result Value   Sodium 133 (*)    Glucose, Bld 109 (*)    BUN <5 (*)    Calcium 8.8 (*)    All other components within normal limits  URINALYSIS, ROUTINE W REFLEX MICROSCOPIC - Abnormal; Notable for the following components:   APPearance HAZY (*)    Hgb urine dipstick LARGE (*)  Protein, ur 30 (*)    All other components within normal limits  URINALYSIS, MICROSCOPIC (REFLEX) - Abnormal; Notable for the following components:   Bacteria, UA FEW (*)    All other components within normal limits  URINE CULTURE  LIPASE, BLOOD  CBC WITH DIFFERENTIAL/PLATELET    EKG: None  Radiology: CT ABDOMEN PELVIS W CONTRAST Result Date: 12/27/2023 EXAM: CT ABDOMEN AND PELVIS WITH CONTRAST 12/27/2023 01:20:15 PM TECHNIQUE: CT of the abdomen and pelvis was performed with the administration of 75 mL of iohexol (OMNIPAQUE) 300 MG/ML solution. Multiplanar reformatted images are provided for review. Automated exposure control, iterative reconstruction, and/or weight-based adjustment of the mA/kV was utilized to reduce the radiation dose to as low as reasonably achievable.  COMPARISON: Hepatobiliary nuclear medicine exam dated 09/27/2010. CLINICAL HISTORY: Right lower quadrant abdominal pain. FINDINGS: LOWER CHEST: No acute abnormality. LIVER: Mild focal steatosis in segment 4b of the liver adjacent to the falciform ligament, incidentally noted. GALLBLADDER AND BILE DUCTS: Gallbladder is unremarkable. No biliary ductal dilatation. SPLEEN: No acute abnormality. PANCREAS: No acute abnormality. ADRENAL GLANDS: No acute abnormality. KIDNEYS, URETERS AND BLADDER: 4.4 x 3.9 cm left kidney upper pole Bosniak category 2 cyst with 1 to 2 mm thin internal septations but with a small focal calcification up to 3 mm along the septation as on image 120 series 9. No enhancement of the septal component greater than 2 mm in diameter. No further imaging workup of this lesion is indicated. No stones in the kidneys or ureters. No hydronephrosis or hydroureter. A calcification close to the right proximal ureter on image 48 of series 2 is shown to be outside of the ureter on image 33 series 11. No perinephric or periureteral stranding. Urinary bladder is unremarkable. GI AND BOWEL: Stomach demonstrates no acute abnormality. There is no bowel obstruction. APPENDIX: Normal appendix. PERITONEUM AND RETROPERITONEUM: No ascites. No free air. VASCULATURE: Aorta is normal in caliber. Systemic atherosclerosis is present, including the aorta and iliac arteries. LYMPH NODES: Enlarged right inguinal lymph node 2.1 cm in short axis on image 78 series 2 with faint surrounding inflammatory stranding. Enlarged right external iliac node 1.5 cm in short axis on image 66 series 2 with faint stranding along the margins. Right common iliac node 1.5 cm in short axis on image 68 series 2 with faint surrounding inflammatory stranding. Given the stranding around these enlarged lymph nodes, reactive/inflammatory lymph nodes are favored over neoplasm. REPRODUCTIVE ORGANS: No acute abnormality. BONES AND SOFT TISSUES: Dextroconvex  lumbar scoliosis with mild rotatory component. No acute osseous abnormality. No focal soft tissue abnormality. IMPRESSION: 1. Enlarged right inguinal and right external iliac lymph nodes with faint surrounding inflammatory stranding, favoring reactive or inflammatory etiology over neoplasm. 2. Dextroconvex lumbar scoliosis with mild rotatory component. 3. Systemic atherosclerosis involving the aorta and iliac arteries. 4. Incidental degenerative and benign-appearing findings, including mild focal hepatic steatosis and a 4.4 x 3.9 cm left renal Bosniak II cyst without enhancement. Electronically signed by: Ryan Salvage MD 12/27/2023 02:08 PM EST RP Workstation: [id number]     Procedures   Medications Ordered in the ED  iohexol (OMNIPAQUE) 300 MG/ML solution 75 mL (75 mLs Intravenous Contrast Given 12/27/23 1306)                                    Medical Decision Making Amount and/or Complexity of Data Reviewed Labs: ordered. Radiology: ordered.  Risk Prescription drug management.  This patient presents to the ED for concern of abdominal pain, this involves an extensive number of treatment options, and is a complaint that carries with it a high risk of complications and morbidity.  The differential diagnosis includes cholecystitis, AAA, appendicitis, renal stone, UTI   Co morbidities that complicate the patient evaluation  N/A   Lab Tests:  I personally interpreted labs.  The pertinent results include:   CBC unremarkable CMP shows a mild hyponatremia 133 Lipase within normal limits UA with large hemoglobin, 11-20 WBC, few bacteria will send for culture given she is having some pelvic pain but she denies urinary frequency dysuria   Imaging Studies ordered:  I ordered imaging studies including CT abd/pelvis   I independently visualized and interpreted imaging which showed right sided inguinal lymphadenopathy and no other acute findings, systemic arthrosclerosis,  incidental renal cyst and liver steatosis I agree with the radiologist interpretation   Cardiac Monitoring: / EKG:  The patient was maintained on a cardiac monitor.    Problem List / ED Course / Critical interventions / Medication management  Patient presents to emergency room with complaint of right lower quadrant abdominal pain and swelling for approximately 5 days.  She denies any other associated symptoms including fever, urinary symptoms, pelvic pain or discharge.  She is hemodynamically stable and well-appearing.  Her lab work is overall reassuring.  She does have large hemoglobin and urine as well as few bacteria.  She is having some suprapubic/pelvic pain so I have sent urine for culture.  Her CT scan does show that she has right inguinal and right external iliac lymphadenopathy which is thought to be reactive > neoplasm.  I did discuss findings with the patient who will follow-up with primary care for consideration of lymph node biopsy.  She does not seem to have any high risk factors or symptoms consistent with pelvic inflammatory disease nor STD. I have reviewed the patients home medicines and have made adjustments as needed. Patient appropriately screened.  Vitals are stable.  Feel appropriate for discharge with close outpatient follow-up.  She was given return precautions.      Final diagnoses:  Inguinal lymphadenopathy    ED Discharge Orders     None          Shermon Warren SAILOR, PA-C 12/27/23 1441    Yolande Lamar BROCKS, MD 12/29/23 [phone]

## 2023-12-27 NOTE — ED Triage Notes (Signed)
 Pt c/o pain and swelling to RLQ since Fri; denies NVD, tolerating food and drink

## 2023-12-27 NOTE — Discharge Instructions (Signed)
 I have sent your urine for culture to rule out urinary tract infection.  Your lab work otherwise appears normal. Your CT scan shows right sided inguinal lymphadenopathy.  You need to follow-up with primary care for reassessment and consider lymph node biopsy if symptoms persist. Return to emergency room with new or worsening symptoms.

## 2023-12-28 ENCOUNTER — Ambulatory Visit: Payer: Self-pay | Admitting: Registered Nurse

## 2023-12-28 ENCOUNTER — Encounter: Payer: Self-pay | Admitting: Registered Nurse

## 2023-12-28 ENCOUNTER — Other Ambulatory Visit: Payer: Self-pay | Admitting: Physician Assistant

## 2023-12-28 DIAGNOSIS — F17200 Nicotine dependence, unspecified, uncomplicated: Secondary | ICD-10-CM

## 2023-12-28 DIAGNOSIS — N281 Cyst of kidney, acquired: Secondary | ICD-10-CM | POA: Insufficient documentation

## 2023-12-28 DIAGNOSIS — N3001 Acute cystitis with hematuria: Secondary | ICD-10-CM

## 2023-12-28 DIAGNOSIS — K760 Fatty (change of) liver, not elsewhere classified: Secondary | ICD-10-CM | POA: Insufficient documentation

## 2023-12-28 DIAGNOSIS — A281 Cat-scratch disease: Secondary | ICD-10-CM

## 2023-12-28 DIAGNOSIS — R590 Localized enlarged lymph nodes: Secondary | ICD-10-CM

## 2023-12-28 DIAGNOSIS — Z0000 Encounter for general adult medical examination without abnormal findings: Secondary | ICD-10-CM

## 2023-12-28 DIAGNOSIS — I700 Atherosclerosis of aorta: Secondary | ICD-10-CM | POA: Insufficient documentation

## 2023-12-28 LAB — POCT URINALYSIS DIPSTICK
Bilirubin, UA: NEGATIVE
Glucose, UA: NEGATIVE
Ketones, UA: NEGATIVE
Nitrite, UA: NEGATIVE
Odor: POSITIVE
Protein, UA: POSITIVE — AB
Spec Grav, UA: 1.01 (ref 1.010–1.025)
Urobilinogen, UA: 0.2 U/dL
pH, UA: 6.5 (ref 5.0–8.0)

## 2023-12-28 LAB — URINE CULTURE: Culture: NO GROWTH

## 2023-12-28 MED ORDER — NITROFURANTOIN MONOHYD MACRO 100 MG PO CAPS: 100.0000 mg | ORAL_CAPSULE | Freq: Two times a day (BID) | ORAL | 0 refills | Status: AC

## 2023-12-28 NOTE — Patient Instructions (Addendum)
 A Guide to Calcium-Rich Foods We all know that milk is a great source of calcium, but you may be surprised by all the different foods you can work into your diet to reach your daily recommended amount of calcium. Use the guide below to get ideas of additional calcium-rich foods to add to your weekly shopping list.  Produce Serving Size Estimated Calcium* Collard greens, cooked 1 cup 266 mg Broccoli rabe, cooked 1 cup 100 mg Kale, cooked 1 cup 179 mg Soybeans, cooked 1 cup 175 mg Bok Choy, cooked 1 cup 160 mg Figs, dried 2 figs 65 mg Broccoli, fresh, cooked 1 cup 60 mg Oranges 1 whole 55 mg Seafood Serving Size Estimated Calcium* Sardines, canned with bones 3 oz 325 mg Salmon, canned with bones 3 oz 180 mg Shrimp, canned 3 oz 125 mg Dairy Serving Size Estimated Calcium* Ricotta, part-skim 4 oz 335 mg Yogurt, plain, low-fat 6 oz 310 mg Milk, skim, low-fat, whole 8 oz 300 mg Yogurt with fruit, low-fat 6 oz 260 mg Mozzarella, part-skim 1 oz 210 mg Cheddar 1 oz 205 mg Yogurt, Greek 6 oz 200 mg American Cheese 1 oz 195 mg Feta Cheese 4 oz 140 mg Cottage Cheese, 2% 4 oz 105 mg Frozen yogurt, vanilla 8 oz 105 mg Ice Cream, vanilla 8 oz 85 mg Parmesan 1 tbsp 55 mg Fortified Food Serving Size Estimated Calcium* Almond milk, rice milk or soy milk, fortified 8 oz 300-450 mg Orange juice and other fruit juices, fortified 8 oz 300 mg Tofu, prepared with calcium 4 oz 205 mg Waffle, frozen, fortified 2 pieces 200 mg Oatmeal, fortified 1 packet 140 mg English muffin, fortified 1 muffin 100 mg Cereal, fortified 8 oz 100-130 mg Other Serving Size Estimated Calcium* Mac & cheese, frozen 1 package 325 mg Pizza, cheese, frozen 1 serving 115 mg Pudding, chocolate, prepared with 2% milk 4 oz 160 mg Beans, baked, canned 4 oz 160 mg *The calcium content listed for most foods is estimated and can vary due to multiple factors. Check the food label to determine how much calcium is in a particular  product.  Lymphadenopathy  Lymphadenopathy is when your lymph glands are swollen or larger than normal.  Lymph glands, also called lymph nodes, are clumps of tissue. They filter germs and waste from tissues in your body to your bloodstream. They're part of your body's defense system, or immune system. Lymphadenopathy has different causes, like infection, autoimmune disease, and cancer. Lymphadenopathy can happen wherever you have lymph nodes. The type you have depends on which nodes it's in, such as: Cervical lymphadenopathy. This is in the neck. Mediastinal lymphadenopathy. This is in the chest. Hilar lymphadenopathy. This is in the lungs. Axillary lymphadenopathy. This is in the armpits. Inguinal lymphadenopathy. This is in the groin. Sometimes, fluid and cells that fight infection build up in your lymph nodes. This happens when your immune system reacts to germs or other substances that get into your body. This makes lymph nodes swell and get bigger. Treatment is based on what's thought to be the cause. Sometimes, lymph nodes don't go back to normal size after treatment. If yours don't, your health care provider may order tests to help learn why your glands are still swollen and big. Follow these instructions at home:  Take over-the-counter and prescription medicines only as told by your provider. If you were prescribed antibiotics, do not stop using them, even if you start to feel better. If told, apply heat to swollen lymph nodes as told  by your provider. Use the heat source that your provider recommends, such as a moist heat pack or a heating pad. Place a towel between your skin and the heat source. Leave the heat on only for the time told by your provider to avoid injury. If your skin turns bright red, remove the heat right away to prevent burns. The risk of burns is higher if you cannot feel pain, heat, or cold. Check your swollen lymph nodes every day for changes. Check other places  where you have lymph nodes as told. Check for changes such as: More swelling. Sudden growth in size. Redness or pain. Hardness. Contact a health care provider if: You have lymph nodes that: Are still swollen after 2 weeks. Have gotten bigger all of a sudden or the swelling spreads. Are red, painful, or hard. Fluid leaks from the skin near a swollen lymph node. You get a fever, chills, or night sweats. You feel tired. You have a sore throat. Your abdomen hurts. You lose weight without trying. This information is not intended to replace advice given to you by your health care provider. Make sure you discuss any questions you have with your health care provider. Document Revised: 03/30/2022 Document Reviewed: 03/30/2022 Elsevier Patient Education  2024 [employer]Proteinuria Proteinuria is when there is too much protein in the urine. Proteins help build muscles and bones. They are also needed to fight infections, help the blood clot, and keep body fluids in balance. Too much protein may be a sign of a problem with the kidneys. The kidneys make urine. They also help keep substances like proteins from leaving the blood and ending up in the urine. In some cases, proteinuria may be mild and short-term. In other cases, it may be an early sign of kidney disease. What are the causes? This condition may be caused by kidney damage or stress on the kidneys. Kidney damage Healthy kidneys have filters called glomeruli that keep proteins out of the urine. This condition can happen when the glomeruli are damaged. This may be from: Diabetes. High blood pressure. Injuries or poisons (toxins). Other causes of kidney damage include: Diseases that affect the body's defense system (immune system). These include lupus, rheumatoid arthritis, sarcoidosis, and Goodpasture syndrome. Infections in the kidney or bloodstream. Problems in other parts of the body that may injure the kidney. These include heart  disease. Certain cancers. These include kidney cancer, lymphoma, leukemia, and multiple myeloma. Amyloidosis. This is a disease that causes too many proteins to build up in body tissues. High blood pressure during pregnancy (preeclampsia and eclampsia). Certain medicines. These include NSAIDs, such as ibuprofen . Stress on the kidneys Short-term proteinuria may be caused by conditions that put stress on the kidneys. In most cases, these conditions do not cause kidney damage. They include: Fever. Exposure to cold or heat. Emotional or physical stress. Extreme exercise. Standing for long periods of time. What increases the risk? You are more likely to develop this condition if: You have certain conditions. These include diabetes, high blood pressure, and heart disease. You have an immune disease, cancer, or other disease that affects your kidneys. You have a family history of kidney disease. You are 63 years of age or older. You are overweight. You are pregnant. You have an infection. What are the signs or symptoms? Mild proteinuria may not cause symptoms. As more proteins enter the urine, symptoms of kidney disease may develop. These may include: Foamy urine. You may also have to urinate more often. Swelling  of the face, abdomen, hands, legs, or feet (edema). Sleeping issues or tiredness (fatigue). Dry and itchy skin. Nausea and vomiting. Muscle cramps. Shortness of breath. How is this diagnosed? This condition may be diagnosed with a urine test. You may have this test as part of a routine physical exam. You may also have this test because you have symptoms of kidney disease or risk factors for the disease. You may also have: Blood tests to measure the level of a substance called creatinine in your blood. Levels of creatinine increase with kidney disease. Imaging tests of your kidney, such as a CT scan or ultrasound. These may be done to look for signs of kidney damage. How is this  treated? If have mild or short-term proteinuria, you may not need treatment. Your health care provider may show you how to monitor the level of protein in your urine at home. Treatment depends on the cause of your condition. Treatment may include: Changes to your diet and lifestyle. Getting your blood pressure under control. Getting blood sugar under control, if you have diabetes. Managing other conditions you have that affect your kidneys. Giving birth, if you are pregnant. Staying away from medicines that damage your kidneys. In severe cases, kidney disease may need to be treated with medicines or dialysis. Dialysis is when a machine is used to do the job of the kidneys. Follow these instructions at home: Eating and drinking Follow instructions from your health care provider about what you may eat and drink. Get to, and maintain, a healthy weight. Ask your health care provider about diets that can help. Activity Ask your health care provider what exercise program is best for you. Return to your normal activities as told by your health care provider. Ask your health care provider what activities are safe for you. General instructions Take over-the-counter and prescription medicines only as told by your health care provider. Check your protein levels at home as told by your health care provider. Keep all follow-up visits. Your health care provider will monitor your kidneys. Contact a health care provider if: You have new symptoms. Your symptoms get worse or do not get better. You have back or side pain. You are vomiting or have diarrhea, and you cannot eat or drink anything. You have a fever. Get help right away if: You have shortness of breath or chest pain. These symptoms may be an emergency. Get help right away. Call 911. Do not wait to see if the symptoms will go away. Do not drive yourself to the hospital. This information is not intended to replace advice given to you by your  health care provider. Make sure you discuss any questions you have with your health care provider. Document Revised: 07/23/2021 Document Reviewed: 07/23/2021 Elsevier Patient Education  2024 [employer]Urinary Tract Infection, Female A urinary tract infection (UTI) is an infection in your urinary tract. The urinary tract is made up of organs that make, store, and get rid of pee (urine) in your body. These organs include: The kidneys. The ureters. The bladder. The urethra. What are the causes? Most UTIs are caused by germs called bacteria. They may be in or near your genitals. These germs grow and cause swelling in your urinary tract. What increases the risk? You're more likely to get a UTI if: You're a female. The urethra is shorter in females than in males. You have a soft tube called a catheter that drains your pee. You can't control when you pee or poop. You  have trouble peeing because of: A kidney stone. A urinary blockage. A nerve condition that affects your bladder. Not getting enough to drink. You're sexually active. You use a birth control inside your vagina, like spermicide. You're pregnant. You have low levels of the hormone estrogen in your body. You're an older adult. You're also more likely to get a UTI if you have other health problems. These may include: Diabetes. A weak immune system. Your immune system is your body's defense system. Sickle cell disease. Injury of the spine. What are the signs or symptoms? Symptoms may include: Needing to pee right away. Peeing small amounts often. Pain or burning when you pee. Blood in your pee. Pee that smells bad or odd. Pain in your belly or lower back. You may also: Feel confused. This may be the first symptom in older adults. Vomit. Not feel hungry. Feel tired or easily annoyed. Have a fever or chills. How is this diagnosed? A UTI is diagnosed based on your medical history and an exam. You may also have other  tests. These may include: Pee tests. Blood tests. Tests for sexually transmitted infections (STIs). If you've had more than one UTI, you may need to have imaging studies done to find out why you keep getting them. How is this treated? A UTI can be treated by: Taking antibiotics or other medicines. Drinking enough fluid to keep your pee pale yellow. In rare cases, a UTI can cause a very bad condition called sepsis. Sepsis may be treated in the hospital. Follow these instructions at home: Medicines Take your medicines only as told by your health care provider. If you were given antibiotics, take them as told by your provider. Do not stop taking them even if you start to feel better. General instructions Make sure you: Pee often and fully. Do not hold your pee for a long time. Wipe from front to back after you pee or poop. Use each tissue only once when you wipe. Pee after you have sex. Do not douche or use sprays or powders in your genital area. Contact a health care provider if: Your symptoms don't get better after 1-2 days of taking antibiotics. Your symptoms go away and then come back. You have a fever or chills. You vomit or feel like you may vomit. Get help right away if: You have very bad pain in your back or lower belly. You faint. This information is not intended to replace advice given to you by your health care provider. Make sure you discuss any questions you have with your health care provider. Document Revised: 12/14/2022 Document Reviewed: 04/08/2022 Elsevier Patient Education  2025 [employer]Hypocalcemia, Adult Hypocalcemia is when the level of calcium in a person's blood is below normal. Calcium is a mineral that is used by the body in many ways. Not having enough blood calcium can affect the nervous system. This can lead to problems with the muscles, the heart, and the brain. What are the causes? This condition may be caused by: A deficiency of vitamin D or  magnesium or both. Decreased levels of parathyroid hormone (hypoparathyroidism). Kidney function problems. Low levels of a body protein called albumin. Inflammation of the pancreas (pancreatitis). Not taking in enough vitamins and minerals in the diet or having intestinal problems that interfere with absorption of nutrients. Certain medicines. What are the signs or symptoms? Symptoms of this condition may include: Numbness and tingling in the fingers, toes, or around the mouth. Muscle twitching, aches, or cramps, especially in  the legs, feet, and back. Spasm of the voice box. This may make it difficult to breathe or speak. Fast heartbeats (palpitations) and abnormal heart rhythms (dysrhythmias). Shaking uncontrollably (seizures). Memory problems, confusion, or difficulty thinking. Depression, anxiety, irritability, or changes in personality. Long-term (chronic) symptoms of this condition may include: Coarse, brittle hair and nails. Dry skin or lasting skin diseases. These include psoriasis, eczema, or dermatitis. Dental cavities. Clouding of the eye lens (cataracts). Some people may not have any symptoms, especially if they have chronic hypocalcemia. How is this diagnosed?  This condition is usually diagnosed with a blood test. You may also have other tests to help determine the underlying cause of the condition. This may include more blood tests and imaging tests. How is this treated? This condition may be treated with: Calcium given by mouth or through an IV. The method used for giving calcium will depend on the severity of the condition. If your condition is severe, you may need to be closely monitored in the hospital. Giving other minerals (electrolytes), such as magnesium. Other treatment will depend on the cause of the condition. Follow these instructions at home: Follow diet instructions from your health care provider or dietitian. Take supplements only as told by your health  care provider. Do not take an iron supplement within 2 hours of taking a calcium supplement. Keep all follow-up visits. This is important. Contact a health care provider if: You have increased muscle twitching or cramps. You have new swelling in the feet, ankles, or legs. You develop changes in mood, memory, or personality. Get help right away if: You have chest pain. You have persistent rapid or irregular heartbeats. You have trouble breathing. You faint. You start to have seizures. You have confusion. These symptoms may represent a serious problem that is an emergency. Do not wait to see if the symptoms will go away. Get medical help right away. Call your local emergency services (911 in the [place] Do not drive yourself to the hospital. Summary Hypocalcemia is when the level of calcium in a person's blood is below normal. Not having enough blood calcium can affect the nervous system. This condition may be treated with calcium given by mouth or through an IV, or by receiving other minerals. Other treatment depends on the cause of the condition. Take supplements only as told by your health care provider. Contact a health care provider if you have new or worsening symptoms. Keep all follow-up visits. This is important. This information is not intended to replace advice given to you by your health care provider. Make sure you discuss any questions you have with your health care provider. Document Revised: 06/10/2020 Document Reviewed: 06/10/2020 Elsevier Patient Education  2024 [employer]Fatty Liver Disease (Steatotic Liver Disease): What to Know  Your liver is an organ with many jobs. It makes proteins and helps change food into energy. It also gets rid of harmful things in your blood and absorbs vitamins from food. Fatty liver disease happens when too much fat builds up in your liver cells. It's also called steatotic liver disease. In many cases, fatty liver disease doesn't cause  symptoms. But over time, it can cause irritation and swelling. This can lead to other liver problems, such as: Cirrhosis, or scarring of the liver. Liver cancer. Liver failure. What are the causes? Fatty liver disease may be caused by: Being overweight. Having: High cholesterol. High blood pressure. Cushing syndrome. Not getting enough nutrients in your diet. Other causes include: Certain drugs. Poisons. Some  infections caused by a germ called a virus. What increases the risk? You're more likely to get fatty liver disease if: You drink alcohol. You're overweight. You have diabetes. You have hepatitis. You have a high triglyceride level. You're pregnant. What are the signs or symptoms? You may not have symptoms. If you do, they may include: Feeling weak and tired. Losing weight. Feeling like you may throw up. Throwing up. Jaundice. This is when your skin or the white parts of your eyes turn yellow. Swelling in your belly or legs. Tenderness in the right-upper part of your belly. How is this diagnosed? Fatty liver disease may be diagnosed based on your medical history and an exam. You may also need tests. These may include: Blood tests. An ultrasound. A CT scan. An MRI. A biopsy. This is when a small piece of tissue is removed from your liver for testing. How is this treated? Fatty liver disease is often caused by other conditions. You may need to take medicines and make changes to your daily life. These changes may help you manage conditions, such as: Alcohol use disorder. This is a condition where you may not be able to stop drinking. High cholesterol. Diabetes. Being overweight. Follow these instructions at home: Eat healthy. Work with your health care provider or an expert in healthy eating called a dietitian. They can help you make an eating plan. Get enough exercise. This can help you lose weight. It can also help you manage your cholesterol and diabetes. Talk to  your provider about an exercise plan. Ask what things are best for you to do. Do not drink alcohol. If you have trouble quitting, ask your provider for help. Take your medicines only as told. Keep all follow-up visits. Your provider will check if you're getting better. Contact a health care provider if: You can't control your blood sugar. This is extra important if you have diabetes. You have a fever. You have swelling in your belly or legs. You have belly pain. You have jaundice. You feel like you may throw up. You throw up. Get help right away if: You throw up, and it looks like: Bright red blood. Coffee grounds. You throw up something that looks like coffee ground. Your poop looks bloody or black. You get confused. These symptoms may be an emergency. Call 911 right away. Do not wait to see if the symptoms will go away. Do not drive yourself to the hospital. This information is not intended to replace advice given to you by your health care provider. Make sure you discuss any questions you have with your health care provider. Document Revised: 06/23/2022 Document Reviewed: 06/23/2022 Elsevier Patient Education  2024 [employer]  A Growth in the Kidney (Renal Mass): What to Know  A renal mass is an abnormal growth in the kidney. It may be found during an MRI, CT scan, or ultrasound that's done to check for other problems in the belly. Some renal masses are cancerous, or malignant, and can grow or spread quickly. Others are benign, which means they're not cancer. Renal masses include: Tumors. These may be either cancerous or benign. The most common cancerous tumor in adults is called renal cell carcinoma. In children, the most common type is Wilms tumor. The most common kidney tumors that aren't cancer include renal adenomas, oncocytomas, and angiomyolipomas (AML). Cysts. These are pockets of fluid that form on or in the kidney. What are the causes? Certain types of cancers,  infections, or injuries can cause a  renal mass. It's not always known what causes a cyst to form in or on the kidney. What are the signs or symptoms? Often, a renal mass or kidney cyst doesn't cause any signs or symptoms. How is this diagnosed? Your health care provider may suggest tests to diagnose the cause of your renal mass. These tests may include: A physical exam. Blood tests. Pee (urine) tests. Imaging tests, such as: CT scan. MRI. Ultrasound. Chest X-ray or bone scan. These may be done if a tumor is cancerous to see if the cancer has spread outside the kidney. Biopsy. This is when a small piece of tissue is removed from the renal mass for testing. How is this treated? Treatment is not always needed for a renal mass. Treatment will depend on the cause of the mass and if it's causing any problems or symptoms. For a cancerous renal mass, treatment options may include: Surgery. This is done to remove the tumor and any affected tissue. Chemotherapy. This uses medicines to kill cancer cells. Radiation. High-energy X-rays or gamma rays are used to kill cancer cells. Ablation. This uses extreme hot or cold temperature to kill the cancer cells. Immunotherapy. Medicines are used to help the body's defense system (immune system) fight the cancer cells. Taking part in clinical trials. This involves trying new or experimental treatments to see if they're effective. Most kidney cysts don't need to be treated. Follow these instructions at home: What you need to do at home will depend on the cause of the mass. Follow the instructions that your provider gives you. In general: Take medicines only as told. If you were given antibiotics, take them as told. Do not stop taking them even if you start to feel better. Follow any instructions from your provider about what you can and can't do. Do not smoke, vape, or use nicotine or tobacco. Keep all follow-up visits. Your provider will need to check if  your renal mass has changed or grown. Contact a health care provider if: You have flank pain, which is pain in your side or back. You have a fever. You have a loss of appetite. You have pain or swelling in your belly. You lose weight. Get help right away if: Your pain gets worse. There's blood in your pee. You can't pee. This information is not intended to replace advice given to you by your health care provider. Make sure you discuss any questions you have with your health care provider. Document Revised: 07/01/2022 Document Reviewed: 07/01/2022 Elsevier Patient Education  2025 [employer]  Nonalcoholic Fatty Liver Disease Diet, Adult Nonalcoholic fatty liver disease is a condition that causes fat to build up in and around the liver. The disease makes it harder for the liver to work the way that it should. Eating a healthy diet of fruits, vegetables, whole grains, lean proteins, and limiting added sugar and fats can help to keep nonalcoholic fatty liver disease under control. It can also help to prevent or improve conditions that are related to the disease, such as heart disease, diabetes, high blood pressure, obesity, and high cholesterol. Along with regular exercise, this diet: Promotes weight loss. Helps to control blood sugar levels. Helps to improve the way that the body uses insulin. What are tips for following this plan? Reading food labels Always check food labels for: The amount of saturated fat in a food. You should limit how much saturated fat you eat. Saturated fat is found in foods that come from animals, including meat and  dairy products such as butter, cheese, and whole milk. The amount of fiber in a food. You should choose high-fiber foods such as fruits, vegetables, and whole grains. Try to get 25-30 grams (g) of fiber a day. Added sugar. Avoid foods with a high amount of added sugar and high fructose corn syrup. Avoid sweetened soft drinks, sweetened tea, lemonade,  sports drinks, and juices that are not 100% juice. Aim for foods with less than 5 grams of added sugar. Every 4 grams of added sugar is 1 teaspoon (tsp) of sugar per serving. Cooking When cooking, use heart-healthy oils that are high in monounsaturated fats. These include olive oil, canola oil, and avocado oil. Limit frying or deep-frying foods. Cook foods using healthy methods such as baking, boiling, steaming, and grilling instead. Meal planning You may want to keep track of how many calories you eat and drink. Eating the right amount of calories will help you achieve a healthy weight. Meeting with a dietitian can help you get started. Limit how often you eat takeout and fast food. These foods are usually very high in fat, salt, and sugar. Use the glycemic index (GI) to plan your meals. The index tells you how quickly a food will raise your blood sugar. Choose low-GI foods. Low-GI foods have a GI less than 55. These foods take a longer time to raise blood sugar. A dietitian can help you pick foods that are lower on the GI scale. Try to include some meals each week that replace meat with beans or legumes. Add fish 2-3 times a week, especially heart healthy oily fishes like salmon, sardines, trout, tuna, or mackerel. Lifestyle You may want to follow a Mediterranean diet. This diet includes a lot of vegetables, lean meats or fish, nuts and seeds, whole grains, fruits, and healthy oils and fats. What foods can I eat?  Fruits Apples. Bananas. Pears. Grapes. Papaya. Plums. Kiwi. Grapefruit. Cherries. Strawberries. Vegetables Lettuce. Spinach. Peas. Beets. Cauliflower. Cabbage. Broccoli. Carrots. Tomatoes. Squash. Eggplant. Herbs. Peppers. Onions. Cucumbers. Brussels sprouts. Yams and sweet potatoes. Grains Whole wheat or whole-grain foods, including breads, crackers, cereals, and pasta. Stone-ground whole wheat. Unsweetened oatmeal. Bulgur. Barley. Quinoa. Brown or wild rice. Corn or whole wheat  flour tortillas. Meats and other proteins Lean meats. Poultry. Tofu. Seafood and shellfish. Beans. Lentils. Dairy Low-fat or fat-free dairy products, such as yogurt, cottage cheese, or cheese. Beverages Water. Sugar-free drinks. Tea. Coffee. Low-fat or skim milk. Milk alternatives, such as unsweetened soy, oat, or almond milk. Real fruit juice. Fats and oils Avocado. Canola or olive oil. Nuts and nut butters. Seeds. Seasonings and condiments Mustard. Relish. Low-fat, low-sugar ketchup and barbecue sauce. Low-fat or fat-free mayonnaise. Sweets and desserts Sugar-free sweets. The items listed above may not be all the foods and drinks you can have. Talk to a dietitian to learn more. What foods should I limit or avoid? Grains White rice. Pasta. Breads. Meats and other proteins Limit red meat to 1-2 times a week. Dairy Full-fat dairy. Fats and oils Palm oil and coconut oil. Fried foods. Other foods Processed foods. Foods that contain a lot of salt (sodium) or added sugar. Sweets and desserts Sweets that contain sugar. Bakery items such as cookies, cakes, and other pastries. Beverages Sweetened drinks, such as sweet tea, milkshakes, iced sweet drinks, and sodas. Alcohol. The items listed above may not be all the foods and drinks you should avoid. Talk to a dietitian to learn more. Where to find more information The [employer] of  Diabetes and Digestive and Kidney Diseases: stagesync.si This information is not intended to replace advice given to you by your health care provider. Make sure you discuss any questions you have with your health care provider. Document Revised: 10/18/2021 Document Reviewed: 10/18/2021 Elsevier Patient Education  2024 [employer].

## 2023-12-28 NOTE — Progress Notes (Signed)
 Established Patient Office Visit  Subjective   Patient ID: Gina Mckenzie, female    DOB: 1965/06/15  Age: 58 y.o. MRN: [id number]  Chief Complaint  Patient presents with   right hip/leg pain and ER visit yesterday    Swollen groin lymph nodes ?UTI    58y/o married caucasian female established patient here for re-evaluation right lower quadrant pain radiating to lower back/hip.  Had scan and labs at ER yesterday.  Here to review results with clinic staff as she was told enlarged lymph node but wasn't given any antibiotics and wondering if she needs some.  Had noticed swelling for more than a day or two right inguinal but unsure how many days it has been present. Used biofreeze on leg/back pain today and it helped.  Denied fever/chills/n/v/d.  Urine has been darker than normal.  Right groin area is tender to palpation and swollen.      Review of Systems  Constitutional:  Negative for chills, diaphoresis, fever and malaise/fatigue.  Cardiovascular:  Negative for leg swelling.  Gastrointestinal:  Positive for abdominal pain. Negative for blood in stool, constipation, diarrhea, melena, nausea and vomiting.  Genitourinary:  Negative for dysuria, frequency and urgency.  Musculoskeletal:  Positive for back pain. Negative for neck pain.  Skin:  Negative for itching and rash.  Neurological:  Negative for dizziness and headaches.  Psychiatric/Behavioral:  The patient does not have insomnia.       Objective:     LMP 07/19/2011    Physical Exam Vitals and nursing note reviewed.  Constitutional:      General: She is awake. She is not in acute distress.    Appearance: Normal appearance. She is well-developed, well-groomed and normal weight. She is not ill-appearing, toxic-appearing or diaphoretic.  HENT:     Head: Normocephalic and atraumatic.     Jaw: There is normal jaw occlusion.     Salivary Glands: Right salivary gland is not diffusely enlarged. Left salivary gland is not diffusely  enlarged.     Right Ear: Hearing and external ear normal.     Left Ear: Hearing and external ear normal.     Nose: Nose normal. No congestion or rhinorrhea.     Mouth/Throat:     Lips: Pink. No lesions.     Mouth: Mucous membranes are moist.     Pharynx: Oropharynx is clear.  Eyes:     General: Lids are normal. Vision grossly intact. Gaze aligned appropriately. No scleral icterus.       Right eye: No discharge.        Left eye: No discharge.     Extraocular Movements: Extraocular movements intact.     Conjunctiva/sclera: Conjunctivae normal.     Pupils: Pupils are equal, round, and reactive to light.  Neck:     Trachea: Trachea and phonation normal.  Pulmonary:     Effort: Pulmonary effort is normal. No respiratory distress.     Breath sounds: Normal breath sounds and air entry. No stridor or transmitted upper airway sounds. No wheezing.     Comments: Spoke full sentences without difficulty; no cough observed in exam room Abdominal:     General: Abdomen is flat. There is no distension. There are no signs of injury.     Palpations: Abdomen is soft.     Tenderness: There is no guarding.      Comments: Swollen TTP  Musculoskeletal:        General: No signs of injury. Normal range of motion.  Cervical back: Normal range of motion and neck supple. No edema, erythema, signs of trauma, rigidity, torticollis or crepitus. No pain with movement. Normal range of motion.     Right hip: No crepitus. Normal range of motion. Normal strength.     Left hip: No crepitus. Normal range of motion. Normal strength.     Right upper leg: No lacerations, tenderness or bony tenderness.     Left upper leg: No lacerations, tenderness or bony tenderness.     Right lower leg: No edema.     Left lower leg: No edema.       Legs:     Comments: Pain right hip/low back and lateral right leg to mid thigh intermittent  Lymphadenopathy:     Head:     Right side of head: No submandibular or preauricular  adenopathy.     Left side of head: No submandibular or preauricular adenopathy.     Cervical: No cervical adenopathy.     Right cervical: No superficial cervical adenopathy.    Left cervical: No superficial cervical adenopathy.     Lower Body: Right inguinal adenopathy present. No left inguinal adenopathy.  Skin:    General: Skin is warm and dry.     Capillary Refill: Capillary refill takes less than 2 seconds.     Coloration: Skin is not ashen, cyanotic, jaundiced, mottled, pale or sallow.     Findings: No abrasion, abscess, acne, bruising, burn, ecchymosis, erythema, signs of injury, laceration, lesion, petechiae, rash or wound.     Comments: Anterior face/neck visually inspected  Neurological:     General: No focal deficit present.     Mental Status: She is alert and oriented to person, place, and time. Mental status is at baseline.     GCS: GCS eye subscore is 4. GCS verbal subscore is 5. GCS motor subscore is 6.     Cranial Nerves: Cranial nerves 2-12 are intact. No cranial nerve deficit, dysarthria or facial asymmetry.     Sensory: Sensation is intact.     Motor: Motor function is intact. No weakness, tremor, atrophy, abnormal muscle tone or seizure activity.     Coordination: Coordination is intact. Coordination normal.     Gait: Gait is intact. Gait normal.     Comments: In/out of chair without difficulty; gait sure and steady in clinic; bilateral hand grasp equal 5/5  Psychiatric:        Attention and Perception: Attention and perception normal.        Mood and Affect: Mood and affect normal.        Speech: Speech normal.        Behavior: Behavior normal. Behavior is cooperative.        Thought Content: Thought content normal.        Cognition and Memory: Cognition and memory normal.        Judgment: Judgment normal.      Results for orders placed or performed in visit on 12/28/23  POCT Urinalysis Dipstick (CPT 81002)  Result Value Ref Range   Color, UA AMBER     Clarity, UA CLOUDY    Glucose, UA Negative Negative   Bilirubin, UA NEG    Ketones, UA NEG    Spec Grav, UA 1.010 1.010 - 1.025   Blood, UA LARGE    pH, UA 6.5 5.0 - 8.0   Protein, UA Positive (A) Negative   Urobilinogen, UA 0.2 0.2 or 1.0 E.U./dL   Nitrite, UA NEG  Leukocytes, UA Trace (A) Negative   Appearance AMBER    Odor POS     Latest Reference Range & Units 12/27/23 11:52 12/28/23 10:00  Comprehensive metabolic panel with GFR  Rpt !   Sodium 135 - 145 mmol/L 133 (L)   Potassium 3.5 - 5.1 mmol/L 3.7   Chloride 98 - 111 mmol/L 98   CO2 22 - 32 mmol/L 23   Glucose 70 - 99 mg/dL 890 (H)   BUN 6 - 20 mg/dL <5 (L)   Creatinine 9.55 - 1.00 mg/dL 9.41   Calcium 8.9 - 89.6 mg/dL 8.8 (L)   Anion gap 5 - 15  12   Alkaline Phosphatase 38 - 126 U/L 62   Albumin 3.5 - 5.0 g/dL 4.3   Lipase 11 - 51 U/L 39   AST 15 - 41 U/L 21   ALT 0 - 44 U/L 11   Total Protein 6.5 - 8.1 g/dL 7.7   Total Bilirubin 0.0 - 1.2 mg/dL 0.3   GFR, Estimated >39 mL/min >60   WBC 4.0 - 10.5 K/uL 7.3   RBC 3.87 - 5.11 MIL/uL 4.17   Hemoglobin 12.0 - 15.0 g/dL 87.0   HCT 63.9 - 53.9 % 37.0   MCV 80.0 - 100.0 fL 88.7   MCH 26.0 - 34.0 pg 30.9   MCHC 30.0 - 36.0 g/dL 65.0   RDW 88.4 - 84.4 % 12.3   Platelets 150 - 400 K/uL 258   nRBC 0.0 - 0.2 % 0.0   Neutrophils % 69   Lymphocytes % 19   Monocytes Relative % 10   Eosinophil % 1   Basophil % 1   Immature Granulocytes % 0   NEUT# 1.7 - 7.7 K/uL 5.1   Lymphs Abs 0.7 - 4.0 K/uL 1.4   Monocyte # 0.1 - 1.0 K/uL 0.7   Eosinophils Absolute 0.0 - 0.5 K/uL 0.0   Basophils Absolute 0.0 - 0.1 K/uL 0.0   Abs Immature Granulocytes 0.00 - 0.07 K/uL 0.02   Urinalysis, Routine w reflex microscopic  Rpt !   Appearance CLEAR  HAZY ! AMBER  Bilirubin Urine NEGATIVE  NEGATIVE   Bilirubin, UA   NEG  Clarity, UA   CLOUDY  Color, UA   AMBER  Color, Urine YELLOW  YELLOW   Glucose Negative   Negative  Glucose, UA NEGATIVE mg/dL NEGATIVE   Hgb urine dipstick  NEGATIVE  LARGE !   Ketones, UA   NEG  Ketones, ur NEGATIVE mg/dL NEGATIVE   Leukocytes,UA Negative   Trace !  Leukocytes,Ua NEGATIVE  NEGATIVE   Nitrite NEGATIVE  NEGATIVE   Nitrite, UA   NEG  pH 5.0 - 8.0  7.0   pH, UA 5.0 - 8.0   6.5  Protein NEGATIVE mg/dL 30 !   Protein,UA Negative   Positive !  Specific Gravity, UA 1.010 - 1.025   1.010  Specific Gravity, Urine 1.005 - 1.030  1.015   Urobilinogen, UA 0.2 or 1.0 E.U./dL  0.2  RBC, UA   LARGE  Bacteria, UA NONE SEEN  FEW !   Mucus  PRESENT   RBC / HPF 0 - 5 RBC/hpf 0-5   Squamous Epithelial / HPF 0 - 5 /HPF 0-5   !: Data is abnormal (L): Data is abnormally low (H): Data is abnormally high Rpt: View report in Results Review for more information  Narrative & Impression  EXAM: CT ABDOMEN AND PELVIS WITH CONTRAST 12/27/2023 01:20:15 PM   TECHNIQUE: CT  of the abdomen and pelvis was performed with the administration of 75 mL of iohexol (OMNIPAQUE) 300 MG/ML solution. Multiplanar reformatted images are provided for review. Automated exposure control, iterative reconstruction, and/or weight-based adjustment of the mA/kV was utilized to reduce the radiation dose to as low as reasonably achievable.   COMPARISON: Hepatobiliary nuclear medicine exam dated 09/27/2010.   CLINICAL HISTORY: Right lower quadrant abdominal pain.   FINDINGS:   LOWER CHEST: No acute abnormality.   LIVER: Mild focal steatosis in segment 4b of the liver adjacent to the falciform ligament, incidentally noted.   GALLBLADDER AND BILE DUCTS: Gallbladder is unremarkable. No biliary ductal dilatation.   SPLEEN: No acute abnormality.   PANCREAS: No acute abnormality.   ADRENAL GLANDS: No acute abnormality.   KIDNEYS, URETERS AND BLADDER: 4.4 x 3.9 cm left kidney upper pole Bosniak category 2 cyst with 1 to 2 mm thin internal septations but with a small focal calcification up to 3 mm along the septation as on image 120 series 9. No enhancement  of the septal component greater than 2 mm in diameter. No further imaging workup of this lesion is indicated. No stones in the kidneys or ureters. No hydronephrosis or hydroureter. A calcification close to the right proximal ureter on image 48 of series 2 is shown to be outside of the ureter on image 33 series 11. No perinephric or periureteral stranding. Urinary bladder is unremarkable.   GI AND BOWEL: Stomach demonstrates no acute abnormality. There is no bowel obstruction.   APPENDIX: Normal appendix.   PERITONEUM AND RETROPERITONEUM: No ascites. No free air.   VASCULATURE: Aorta is normal in caliber. Systemic atherosclerosis is present, including the aorta and iliac arteries.   LYMPH NODES: Enlarged right inguinal lymph node 2.1 cm in short axis on image 78 series 2 with faint surrounding inflammatory stranding. Enlarged right external iliac node 1.5 cm in short axis on image 66 series 2 with faint stranding along the margins. Right common iliac node 1.5 cm in short axis on image 68 series 2 with faint surrounding inflammatory stranding. Given the stranding around these enlarged lymph nodes, reactive/inflammatory lymph nodes are favored over neoplasm.   REPRODUCTIVE ORGANS: No acute abnormality.   BONES AND SOFT TISSUES: Dextroconvex lumbar scoliosis with mild rotatory component. No acute osseous abnormality. No focal soft tissue abnormality.   IMPRESSION: 1. Enlarged right inguinal and right external iliac lymph nodes with faint surrounding inflammatory stranding, favoring reactive or inflammatory etiology over neoplasm. 2. Dextroconvex lumbar scoliosis with mild rotatory component. 3. Systemic atherosclerosis involving the aorta and iliac arteries. 4. Incidental degenerative and benign-appearing findings, including mild focal hepatic steatosis and a 4.4 x 3.9 cm left renal Bosniak II cyst without enhancement.   Electronically signed by: Ryan Salvage MD  12/27/2023 02:08 PM EST RP Workstation: [id number]     The ASCVD Risk score (Arnett DK, et al., 2019) failed to calculate for the following reasons:   Cannot find a previous HDL lab   Cannot find a previous total cholesterol lab   * - Cholesterol units were assumed    Assessment & Plan:   Problem List Items Addressed This Visit       Cardiovascular and Mediastinum   Aortic atherosclerosis   Relevant Orders   Lipid Profile     Digestive   Steatosis of liver   Relevant Orders   Lipid Profile     Genitourinary   Renal cyst, left   Other Visit Diagnoses  Lymphadenopathy, inguinal    -  Primary   Relevant Orders   POCT Urinalysis Dipstick (CPT 81002) (Completed)   Urine Culture     Acute cystitis with hematuria       Relevant Orders   POCT Urinalysis Dipstick (CPT 81002)     Hypocalcemia       Relevant Orders   Calcium,serum   Basic Metabolic Panel (BMET)     Tobacco use disorder         Dipstick urinalysis x 1 now POC and urine culture to [facility] today.  Medications as directed.  Positive leukocytes today and yesterday bacteria and leukocytes ER therefore nitrofurantoin 100mg  po BID x 7 days #14 RF0  Increase water intake as urine dark. Hydrate, avoid dehydration. Avoid holding urine void on frequent basis every 4 to 6 hours. If unable to void every 8 hours follow up for re-evaluation with PCM, urgent care or ER. Call or return to clinic as needed if these symptoms worsen or fail to improve as anticipated. Exitcare handout on cystitis, proteinuria, hypocalcermia   Given handout on calcium rich foods and exitcare handout hypcalcemia.   Symptoms of low calcium include muscle cramps, dry scaly skin, brittle nails, coarse hair, confusion/memory problems, irritability, restlessness, depression, hallucinations, tingling in lips, tongue, fingers/feet, muscle aches, throat spasms making it difficult to breath, seizures, and/or palpitations/heart skipping beat.  I recommend  calcium rich foods instead of supplement/tums due to proteinuria/hematuria/UTI at this time.  If symptoms of low calcium worsening notify your provider same day.  Patient verbalized understanding information/instructions, agreed with plan of care and had no further questions at this time.   P2: Hydrate and avoid NSAIDS  Discussed lab and CT ER results with patient.  Consider nephrology referral for left renal cyst per classification Bosniak II  [facility] accepting new patients with referral and in network with her insurance per website.  Repeat dipstick urinalysis in 1 month with RN Karene along with BMET nonfasting. Bosniak II benign cyst - minimally complex few hairlines thin <1 mm septa or thin calcifications (thickness not measurable) perceived but no measurable enhancement non-enhancing high-attenuation (due to proteinaceous or hemorrhagic contents) renal lesions <3 cm generally well marginated work-up: none percentage malignant: ~0-6% 17,18  Bosniak IIF minimally complex multiple hairline thin septa or minimally smooth thickened walls or septa perceived but no measurable enhancement of wall or septa calcification can be present and may be thick and nodular generally well marginated high-attenuation lesion >3 cm diameter, totally intrarenal (<25% of wall visible); no enhancement requiring follow-up (F for follow-up): needs ultrasound/CT/MRI follow up - no strict rules on the time frame but reasonable at 6 months, 12 months, then annually for 5 years 3 percentage malignant: ~5-26% 6,19-21  Fatty liver and atherosclerosis aorta on CT consider lipid panel with next lab draw.as none on file per Epic.  Consider initiation of statin due to atherosclerosis on imaging.  Consider tobacco cessation previously discussed with patient this year and stated she was not interested in stopping at that time.  Exitcare handout atherosclerosis.  If lymphadenopathy does not resolve  after UTI treatment further work up recommended also.  Discussed with patient CBC was normal but with CT renal cyst consider nephrology consult.   Return in 1 month (on 01/28/2024), or if symptoms worsen or fail to improve, for repeat dipstick urinalysis and calcium level, lipid and Hgba1c.    Ellouise DELENA Hope, NP

## 2023-12-31 LAB — URINE CULTURE

## 2024-01-01 NOTE — Telephone Encounter (Signed)
 Notified RN Karene patient urine culture was normal no UTI and to repeat dipstick urinalysis if patient still having symptoms and verify what symptoms occurred over the weekend/today.  Per [facility] poUrine Culture, Routine  Final report Mixed urogenital flora Less than 10,000 colonies/mLrtal urine culture   Patient my chart not set up to send message.  Continue increased water intake to keep urine pale yellow clear and voiding every 2-4 hours while awake.  DDx kidney stone, glomerulonephritis, autoimmune disease, renal injury

## 2024-01-02 ENCOUNTER — Encounter: Payer: Self-pay | Admitting: Registered Nurse

## 2024-01-02 MED ORDER — AZITHROMYCIN 250 MG PO TABS: ORAL_TABLET | ORAL | 0 refills | Status: AC

## 2024-01-02 NOTE — Telephone Encounter (Signed)
 Spoke with patient via telephone 01/02/24 notified of urine culture results normal/negative.  New symptoms today rash, flu like symptoms along with back pain unchanged; patient with 20 cats she cares for at home.  Right inguinal lymph node still enlarged and tender. treated for cat scratch fever with azithromycin  rx sent to her pharmacy of choice  exitcare handout on cat scratch fever sent to personal email and instructed to stop nitrofurantoin .  Patient cleared to return to work 01/03/24 as long as no new or worsening symptoms today.  Next re-evaluation Thursday 01/04/24  Patient agreed with plan of care and had no further questions at this time.

## 2024-01-02 NOTE — Telephone Encounter (Signed)
 Patient stated went to urgent care yesterday for re-evaluation.  Woke up  today still having enlarged lymph node but new rash on face and couple spots on hand, along with feeling like she has flu  Her daughter instructed her to tell provider she has cats at home and check for cat scratch fever as she has been having sweats at night and sometimes chills now also along with rash.  Patient reported she has 20 cats at home she cares for.  One cat was licking her a lot last week on her face.  Cannot remember any scratches this week from cats.  Denied fever has thermometer at home and will check temperature if having chills/sweats.  Electronic Rx sent to her pharmacy of choice azithromycin  250mg  take 2 tabs day 1 then 1 tab days 2-4 #6 RF0 to her pharmacy of choice.  Stop nitrofurantion.  Discussed urine culture was negative for UTI.  Exitcare handout on cat scratch fever to my chart and personal email [email or web address].  Patient cleared to return to work tomorrow.  To notify clinic staff if new or worsening symptoms today e.g. fever greater than 100.13F.  Patient A&Ox3 spoke full sentences without difficulty no audible cough/congestion/throat clearing during 5 minute call.  Discussed with patient clinic closing at 3pm today reopens 8am tomorrow [phone].  Patient verbalized understanding information/instructions, agreed with plan of care and had no further questions at this time.

## 2024-01-04 ENCOUNTER — Telehealth: Payer: Self-pay | Admitting: Registered Nurse

## 2024-01-04 DIAGNOSIS — K130 Diseases of lips: Secondary | ICD-10-CM

## 2024-01-04 DIAGNOSIS — W010XXA Fall on same level from slipping, tripping and stumbling without subsequent striking against object, initial encounter: Secondary | ICD-10-CM

## 2024-01-04 DIAGNOSIS — S76112A Strain of left quadriceps muscle, fascia and tendon, initial encounter: Secondary | ICD-10-CM

## 2024-01-04 MED ORDER — AQUAPHOR EX OINT: TOPICAL_OINTMENT | CUTANEOUS | Status: AC | PRN

## 2024-01-04 MED ORDER — IBUPROFEN 800 MG PO TABS: 800.0000 mg | ORAL_TABLET | Freq: Three times a day (TID) | ORAL | 0 refills | Status: AC | PRN

## 2024-01-04 NOTE — Telephone Encounter (Signed)
 Enlarged lymph node not as tender and decreasing in size since starting azithromycin .  Given aquaphor ointment UD x4 from clinic stock to apply to lips.  Discussed avoid rubbing or licking lips and stop chapstick use.  May use aquaphor or vaseline put smear on both lips until skin healed and no further irritation noted.  Follow up in 1 week to repeat dipstick urinalysis with RN Karene.  Patient also stated was holding child and slipped/tripped at home and now having bilateral leg pain.  Denied loss of bowel/bladder control, saddle parestheias or arm/leg weakness.  Using biofreeze and advil  200mg .  Dispensed ibuprofen  800mg  po q8h to patient today from PDRx #30 RF0 may take tylenol  1000mg  po q6h prn pain alternate with ibuprofen  also.  Take with food.  Thermacare patch given to patient from clinic stock today for trial also.  Patient verbalized understanding information/instructions, agreed with plan of care and had no further questions at this time.

## 2024-01-08 ENCOUNTER — Ambulatory Visit: Payer: Self-pay | Admitting: Physician Assistant

## 2024-01-08 NOTE — Progress Notes (Signed)
 This is not our patient.  Please forward to PCP

## 2024-01-09 NOTE — Progress Notes (Signed)
 Patient was notified of results negative for UTI last week.  Follow up planned today.

## 2024-01-10 NOTE — Telephone Encounter (Signed)
 Patient came to clinic stated symptoms improving lip rash and arm rash has resolved with azithromycin  use.  Lymph node tenderness and swelling improving every day.  Patient asked about lab results discussed again no UTI noted and to have repeat dipstick urinalysis in a week with RN Karene.  Patient A&Ox3 skin warm dry and pink papules around lips vermillion border resolved along with papules on hands/arms.  Gait sure and steady respirations even and unlabored RA.  Patient reported working in [employer] without difficulty.  Patient agreed with plan of care and had no further questions at that time.

## 2024-01-10 NOTE — Telephone Encounter (Signed)
 See tcon dated 01/04/24

## 2024-02-13 NOTE — Telephone Encounter (Signed)
 Patient reported all injuries have healed and lymph node swelling resolved denied further questions or concerns at this time.  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

## 2024-02-23 ENCOUNTER — Telehealth: Payer: Self-pay | Admitting: Registered Nurse

## 2024-02-23 DIAGNOSIS — R310 Gross hematuria: Secondary | ICD-10-CM
